# Patient Record
Sex: Female | Born: 1970 | Race: White | Hispanic: No | Marital: Married | Smoking: Former smoker
Health system: Southern US, Community
[De-identification: ages and names within clinical notes are randomized; demographics above are authoritative.]

## PROBLEM LIST (undated history)

## (undated) DIAGNOSIS — F419 Anxiety disorder, unspecified: Secondary | ICD-10-CM

## (undated) DIAGNOSIS — K219 Gastro-esophageal reflux disease without esophagitis: Secondary | ICD-10-CM

## (undated) DIAGNOSIS — F329 Major depressive disorder, single episode, unspecified: Secondary | ICD-10-CM

## (undated) DIAGNOSIS — I10 Essential (primary) hypertension: Secondary | ICD-10-CM

## (undated) DIAGNOSIS — F32A Depression, unspecified: Secondary | ICD-10-CM

## (undated) DIAGNOSIS — G2581 Restless legs syndrome: Secondary | ICD-10-CM

## (undated) DIAGNOSIS — F319 Bipolar disorder, unspecified: Secondary | ICD-10-CM

## (undated) DIAGNOSIS — R109 Unspecified abdominal pain: Secondary | ICD-10-CM

## (undated) HISTORY — DX: Essential (primary) hypertension: I10

## (undated) HISTORY — PX: CHOLECYSTECTOMY: SHX55

## (undated) HISTORY — PX: TONSILLECTOMY: SUR1361

## (undated) HISTORY — PX: BARIATRIC SURGERY: SHX1103

## (undated) HISTORY — DX: Depression, unspecified: F32.A

## (undated) HISTORY — DX: Major depressive disorder, single episode, unspecified: F32.9

## (undated) HISTORY — PX: GALLBLADDER SURGERY: SHX652

## (undated) HISTORY — DX: Bipolar disorder, unspecified: F31.9

## (undated) HISTORY — DX: Anxiety disorder, unspecified: F41.9

---

## 2008-09-23 DIAGNOSIS — G629 Polyneuropathy, unspecified: Secondary | ICD-10-CM | POA: Insufficient documentation

## 2014-10-03 DIAGNOSIS — R251 Tremor, unspecified: Secondary | ICD-10-CM | POA: Insufficient documentation

## 2014-10-03 DIAGNOSIS — R259 Unspecified abnormal involuntary movements: Secondary | ICD-10-CM | POA: Insufficient documentation

## 2014-10-03 DIAGNOSIS — G2581 Restless legs syndrome: Secondary | ICD-10-CM | POA: Insufficient documentation

## 2014-11-21 DIAGNOSIS — G479 Sleep disorder, unspecified: Secondary | ICD-10-CM | POA: Insufficient documentation

## 2015-03-23 ENCOUNTER — Encounter: Payer: Self-pay | Admitting: Psychiatry

## 2015-03-23 ENCOUNTER — Ambulatory Visit (INDEPENDENT_AMBULATORY_CARE_PROVIDER_SITE_OTHER): Payer: No Typology Code available for payment source | Admitting: Psychiatry

## 2015-03-23 VITALS — BP 122/82 | HR 101 | Temp 98.0°F | Ht 63.0 in | Wt 212.0 lb

## 2015-03-23 DIAGNOSIS — F3160 Bipolar disorder, current episode mixed, unspecified: Secondary | ICD-10-CM | POA: Diagnosis not present

## 2015-03-23 DIAGNOSIS — K219 Gastro-esophageal reflux disease without esophagitis: Secondary | ICD-10-CM | POA: Insufficient documentation

## 2015-03-23 DIAGNOSIS — F319 Bipolar disorder, unspecified: Secondary | ICD-10-CM | POA: Insufficient documentation

## 2015-03-23 DIAGNOSIS — I10 Essential (primary) hypertension: Secondary | ICD-10-CM | POA: Insufficient documentation

## 2015-03-23 MED ORDER — LITHIUM CARBONATE ER 450 MG PO TBCR: 450.0000 mg | EXTENDED_RELEASE_TABLET | Freq: Every day | ORAL | Status: DC

## 2015-03-23 MED ORDER — ALPRAZOLAM 0.25 MG PO TABS: 0.2500 mg | ORAL_TABLET | Freq: Every evening | ORAL | Status: DC | PRN

## 2015-03-23 MED ORDER — VENLAFAXINE HCL ER 75 MG PO CP24: 75.0000 mg | ORAL_CAPSULE | Freq: Every day | ORAL | Status: DC

## 2015-03-23 MED ORDER — QUETIAPINE FUMARATE 100 MG PO TABS: 50.0000 mg | ORAL_TABLET | Freq: Every day | ORAL | Status: DC
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
## 2015-04-24 ENCOUNTER — Ambulatory Visit (INDEPENDENT_AMBULATORY_CARE_PROVIDER_SITE_OTHER): Payer: No Typology Code available for payment source | Admitting: Psychiatry

## 2015-04-24 ENCOUNTER — Encounter: Payer: Self-pay | Admitting: Psychiatry

## 2015-04-24 VITALS — BP 138/82 | HR 91 | Temp 97.9°F | Ht 63.0 in | Wt 205.8 lb

## 2015-04-24 DIAGNOSIS — F3160 Bipolar disorder, current episode mixed, unspecified: Secondary | ICD-10-CM | POA: Diagnosis not present

## 2015-04-24 MED ORDER — VENLAFAXINE HCL ER 75 MG PO CP24: 75.0000 mg | ORAL_CAPSULE | Freq: Every day | ORAL | Status: DC

## 2015-04-24 MED ORDER — QUETIAPINE FUMARATE ER 150 MG PO TB24: 150.0000 mg | ORAL_TABLET | Freq: Every day | ORAL | Status: DC
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
## 2015-05-18 ENCOUNTER — Ambulatory Visit: Payer: No Typology Code available for payment source | Admitting: Psychiatry

## 2015-05-23 ENCOUNTER — Ambulatory Visit: Payer: No Typology Code available for payment source | Admitting: Psychiatry

## 2015-05-25 ENCOUNTER — Ambulatory Visit (INDEPENDENT_AMBULATORY_CARE_PROVIDER_SITE_OTHER): Payer: No Typology Code available for payment source | Admitting: Psychiatry

## 2015-05-25 ENCOUNTER — Encounter: Payer: Self-pay | Admitting: Psychiatry

## 2015-05-25 VITALS — BP 124/88 | HR 78 | Temp 97.4°F | Ht 63.0 in | Wt 203.4 lb

## 2015-05-25 DIAGNOSIS — F319 Bipolar disorder, unspecified: Secondary | ICD-10-CM | POA: Diagnosis not present

## 2015-05-25 MED ORDER — QUETIAPINE FUMARATE 25 MG PO TABS: 25.0000 mg | ORAL_TABLET | Freq: Four times a day (QID) | ORAL | Status: DC

## 2015-05-25 MED ORDER — VENLAFAXINE HCL ER 75 MG PO CP24: 75.0000 mg | ORAL_CAPSULE | Freq: Every day | ORAL | Status: DC

## 2015-05-25 MED ORDER — ALPRAZOLAM 0.25 MG PO TABS
0.2500 mg | ORAL_TABLET | Freq: Every evening | ORAL | Status: DC | PRN
Start: 2015-05-25 — End: 2015-09-28
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
## 2015-06-06 DIAGNOSIS — N939 Abnormal uterine and vaginal bleeding, unspecified: Secondary | ICD-10-CM | POA: Insufficient documentation

## 2015-06-23 ENCOUNTER — Other Ambulatory Visit: Payer: Self-pay | Admitting: Obstetrics and Gynecology

## 2015-06-23 DIAGNOSIS — D259 Leiomyoma of uterus, unspecified: Secondary | ICD-10-CM

## 2015-07-04 ENCOUNTER — Ambulatory Visit: Payer: Self-pay

## 2015-08-23 NOTE — Progress Notes (Signed)
According to note on  04-24-15 pt is not taking the xanax and the lithium anymore.  According to note on  05-25-15 pt is still taking the seroquel  And effexor

## 2015-09-08 ENCOUNTER — Telehealth: Payer: Self-pay

## 2015-09-08 NOTE — Telephone Encounter (Signed)
left message that per dr. Jimmye Norman adviced to go to

## 2015-09-08 NOTE — Telephone Encounter (Signed)
pt called states she needs refill on her medications.  pt did not have a follow up appt scheduled so patient was made the next available for 09-28-15.  pt states that she is out of medication.  pt was told that dr. Gretel Acre was not in the office and would be out of the office until 09-28-15.  Pt was advised to go to er because dr. Gretel Acre would not be back into the office in January.

## 2015-09-11 NOTE — Telephone Encounter (Signed)
pt called .  pt was told that Dr. was out of the office and that she was advised from doctor on-call to go to er.  pt was very upset and wanted to speak with the supervisor. pt was given the [place] phone number.

## 2015-09-13 ENCOUNTER — Telehealth (HOSPITAL_COMMUNITY): Payer: Self-pay

## 2015-09-13 NOTE — Telephone Encounter (Signed)
Medication management - Telephone message left for pt. this RN received her call that she is in need of new orders for meds as was last prescribed Effexor XR and Seroquel 06/14/15 + 2 refills and needs new orders until returns 09/28/15 with Dr. Gretel Acre.   Patient reported she did not want to be out of medication while awaiting MD to return and informed on message this nurse would attempt to have filled and call her back once reviewed with a provider as patient states out as of this date.

## 2015-09-14 MED ORDER — QUETIAPINE FUMARATE 25 MG PO TABS: 25.0000 mg | ORAL_TABLET | Freq: Four times a day (QID) | ORAL | Status: DC

## 2015-09-14 MED ORDER — VENLAFAXINE HCL ER 75 MG PO CP24
75.0000 mg | ORAL_CAPSULE | Freq: Every day | ORAL | Status: DC
Start: 2015-09-14 — End: 2015-09-28

## 2015-09-14 NOTE — Telephone Encounter (Signed)
Yes, can do it

## 2015-09-14 NOTE — Telephone Encounter (Signed)
Met with Dr. Donnelly Angelica, helping to cover for Dr. Gretel Acre out of the office this week, who approved a one time refill of patient's Seroquel and Effexor XR.  New orders e-scribed to patient's [facility] in [place], [place] and left patient a message these orders had been sent to her pharmacy and to call us back if any problems getting refills or questions.  Reminded patient on message of her next evaluation with Dr. Gretel Acre set for 09/28/15 at 2pm.

## 2015-09-15 NOTE — Telephone Encounter (Signed)
Reorder

## 2015-09-28 ENCOUNTER — Encounter: Payer: Self-pay | Admitting: Psychiatry

## 2015-09-28 ENCOUNTER — Ambulatory Visit (INDEPENDENT_AMBULATORY_CARE_PROVIDER_SITE_OTHER): Payer: 59 | Admitting: Psychiatry

## 2015-09-28 VITALS — BP 128/78 | HR 103 | Temp 97.4°F | Ht 63.0 in | Wt 206.6 lb

## 2015-09-28 DIAGNOSIS — F3160 Bipolar disorder, current episode mixed, unspecified: Secondary | ICD-10-CM | POA: Diagnosis not present

## 2015-09-28 MED ORDER — VENLAFAXINE HCL ER 75 MG PO CP24: 75.0000 mg | ORAL_CAPSULE | Freq: Every day | ORAL | Status: DC

## 2015-09-28 MED ORDER — ALPRAZOLAM 0.25 MG PO TABS: 0.2500 mg | ORAL_TABLET | Freq: Every evening | ORAL | Status: DC | PRN

## 2015-09-28 MED ORDER — QUETIAPINE FUMARATE 25 MG PO TABS: 25.0000 mg | ORAL_TABLET | Freq: Four times a day (QID) | ORAL | Status: DC
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
## 2015-10-19 ENCOUNTER — Ambulatory Visit
Admission: RE | Admit: 2015-10-19 | Discharge: 2015-10-19 | Disposition: A | Discharge status: Home / Self Care | Payer: Managed Care, Other (non HMO) | Source: Ambulatory Visit | Attending: Obstetrics and Gynecology | Admitting: Obstetrics and Gynecology

## 2015-10-19 DIAGNOSIS — D252 Subserosal leiomyoma of uterus: Secondary | ICD-10-CM | POA: Diagnosis not present

## 2015-10-19 DIAGNOSIS — D259 Leiomyoma of uterus, unspecified: Secondary | ICD-10-CM

## 2015-10-19 DIAGNOSIS — N7011 Chronic salpingitis: Secondary | ICD-10-CM | POA: Diagnosis not present

## 2015-10-19 MED ORDER — GADOBENATE DIMEGLUMINE 529 MG/ML IV SOLN
20.0000 mL | Freq: Once | INTRAVENOUS | Status: AC | PRN
  Administered 2015-10-19: 19 mL via INTRAVENOUS

## 2015-12-28 ENCOUNTER — Ambulatory Visit (INDEPENDENT_AMBULATORY_CARE_PROVIDER_SITE_OTHER): Payer: 59 | Admitting: Psychiatry

## 2015-12-28 ENCOUNTER — Encounter: Payer: Self-pay | Admitting: Psychiatry

## 2015-12-28 VITALS — BP 122/84 | HR 99 | Temp 98.5°F | Ht 63.0 in | Wt 205.0 lb

## 2015-12-28 DIAGNOSIS — F3160 Bipolar disorder, current episode mixed, unspecified: Secondary | ICD-10-CM

## 2015-12-28 MED ORDER — VENLAFAXINE HCL ER 75 MG PO CP24: 75.0000 mg | ORAL_CAPSULE | Freq: Every day | ORAL | Status: DC

## 2015-12-28 MED ORDER — QUETIAPINE FUMARATE 25 MG PO TABS: 25.0000 mg | ORAL_TABLET | Freq: Four times a day (QID) | ORAL | Status: DC
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
## 2016-01-01 MED ORDER — BUPIVACAINE HCL (PF) 0.5 % IJ SOLN
INTRAMUSCULAR | Status: AC
  Filled 2016-01-01: qty 30

## 2016-01-02 ENCOUNTER — Encounter
Admission: RE | Admit: 2016-01-02 | Discharge: 2016-01-02 | Disposition: A | Discharge status: Home / Self Care | Payer: Managed Care, Other (non HMO) | Source: Ambulatory Visit | Attending: Obstetrics and Gynecology | Admitting: Obstetrics and Gynecology

## 2016-01-02 DIAGNOSIS — Z01810 Encounter for preprocedural cardiovascular examination: Secondary | ICD-10-CM | POA: Diagnosis not present

## 2016-01-02 DIAGNOSIS — Z01812 Encounter for preprocedural laboratory examination: Secondary | ICD-10-CM | POA: Diagnosis present

## 2016-01-02 HISTORY — DX: Gastro-esophageal reflux disease without esophagitis: K21.9

## 2016-01-02 HISTORY — DX: Restless legs syndrome: G25.81

## 2016-01-02 LAB — CBC
HCT: 36 % (ref 35.0–47.0)
HEMOGLOBIN: 11.6 g/dL — AB (ref 12.0–16.0)
MCH: 25.3 pg — ABNORMAL LOW (ref 26.0–34.0)
MCHC: 32.2 g/dL (ref 32.0–36.0)
MCV: 78.7 fL — ABNORMAL LOW (ref 80.0–100.0)
PLATELETS: 416 10*3/uL (ref 150–440)
RBC: 4.57 MIL/uL (ref 3.80–5.20)
RDW: 18.6 % — ABNORMAL HIGH (ref 11.5–14.5)
WBC: 10.8 10*3/uL (ref 3.6–11.0)

## 2016-01-02 LAB — BASIC METABOLIC PANEL
ANION GAP: 6 (ref 5–15)
BUN: 7 mg/dL (ref 6–20)
CALCIUM: 9.1 mg/dL (ref 8.9–10.3)
CO2: 27 mmol/L (ref 22–32)
Chloride: 102 mmol/L (ref 101–111)
Creatinine, Ser: 0.72 mg/dL (ref 0.44–1.00)
GFR calc Af Amer: >60 mL/min (ref >60)
Glucose, Bld: 95 mg/dL (ref 65–99)
POTASSIUM: 4 mmol/L (ref 3.5–5.1)
SODIUM: 135 mmol/L (ref 135–145)

## 2016-01-02 LAB — ABO/RH: ABO/RH(D): A NEG

## 2016-01-02 LAB — TYPE AND SCREEN
ABO/RH(D): A NEG
ANTIBODY SCREEN: NEGATIVE

## 2016-01-02 NOTE — H&P (Signed)
Patient ID: Tamara Thomas is a 45 y.o. female presenting with Pre Op Consulting  on 01/02/2016  HPI: F/u for MRI for surgical planning. Pelvic pain, AUB-F. Large fibroids x2, exophytic. Bleeding resolved but pain remains. Pain described as sharp and crampy, present consistently, along round ligament bilaterally. Moloxicam and flexeril not improving pain. 800mg  motrin does for an 1hr.  Pt has hx of lap band (2006) and gastric sleeve surgeries (2016), as well as two D&C after SABs.  MRI 10/19/2015:  Uterine measures 10.7 cm in length by 5.7 cm transverse by 4.8 cm  anterior-posterior if exclude the large left posterior primarily  subserosal fibroid. The endometrial stripe measures 1.1 cm in  thickness which is normal for age. There is diffusely low T2 signal  in the uterus which could reflect adenomyosis presuming this  represents thickening of the junctional zone. Multiple nabothian  cysts along the cervix.   Along the left posterior uterine body, there is an 11.4 by 6.9 by  9.7 cm subserosal exophytic uterine fibroid which demonstrates  diffuse enhancement with some nodular heterogeneity. No other  fibroids are observed.   Ovaries/adnexa: There is mild bilateral hydrosalpinx. Right ovary  measures 3.5 by 2.6 by 3.9 cm and appears normal. Left ovary  measures 3.0 by 3.2 by 2.4 cm and appears normal. No abnormal  enhancement of either ovary. Both ovaries demonstrate scattered  small follicles. One of the follicles in in the right ovary  posteriorly has high precontrast T1 signal suggesting complex fluid  in a corpus luteum.   IMPRESSION:  1. The dominant finding is a large subserosal fibroid along the left  posterior uterine body measuring 11.4 by 6.9 by 9.7 cm, with diffuse  internal enhancement with some nodular heterogeneity, displacing the  uterus slightly to the right.  2. Mild bilateral hydrosalpinx, cause uncertain. The ovaries  appear  normal bilaterally.  3. There is low T2 signal throughout the myometrium -this may  represent thickening of the junctional zone in the setting of  adenomyosis. However, we do not demonstrate foci of high T1 signal  scattered in the myometrium.  Past Medical History:  has a past medical history of Bipolar disorder (CMS-HCC); Essential hypertension with goal blood pressure less than 140/90; GERD without esophagitis; Restless legs syndrome; and Tremor.  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 Family History: family history includes Hypertension in her father; No Known Problems in her sister; Tremor in her father. Social History:  reports that she has been smoking Cigarettes. She has a 14.00 pack-year smoking history. She has never used smokeless tobacco. She reports that she does not drink alcohol or use illicit drugs. OB/GYN History:  OB History    Gravida Para Term Preterm AB TAB SAB Ectopic Multiple Living   2    2  2          Allergies: is allergic to erythromycin. Medications:  Current Outpatient Prescriptions:  . ALPRAZolam (XANAX) 0.25 MG tablet, Take by mouth once daily as needed. , Disp: , Rfl:  . carbidopa-levodopa (SINEMET) 25-100 mg tablet, Take 1-2 tablets at night, Disp: 60 tablet, Rfl: 1 . cyclobenzaprine (FLEXERIL) 10 MG tablet, Take 1 tablet (10 mg total) by mouth 3 (three) times daily as needed for Muscle spasms. Take one tablet 3 x a day prn muscle spasms., Disp: 30 tablet, Rfl: 1 . DEXILANT 60 mg  DR capsule, TAKE ONE CAPSULE BY MOUTH EVERY DAY, Disp: 30 capsule, Rfl: 0 . fluticasone (FLONASE) 50 mcg/actuation nasal spray, Place 2 sprays into both nostrils once daily as needed for Rhinitis., Disp: , Rfl:  . gabapentin (NEURONTIN) 800 MG tablet, Take 1/2 tab at night for rls,  Disp: 15 tablet, Rfl: 3 . hydroCHLOROthiazide (HYDRODIURIL) 12.5 MG tablet, Take 1 tablet (12.5 mg total) by mouth once daily., Disp: 30 tablet, Rfl: 3 . losartan (COZAAR) 100 MG tablet, Take 1 tablet (100 mg total) by mouth once daily., Disp: 90 tablet, Rfl: 1 . meloxicam (MOBIC) 7.5 MG tablet, Take 1 tablet (7.5 mg total) by mouth once daily., Disp: 30 tablet, Rfl: 3 . metoclopramide (REGLAN) 10 MG tablet, 1 tab with meals as needed, Disp: 90 tablet, Rfl: 3 . pramipexole (MIRAPEX) 0.5 MG tablet, TAKE 1 1/2 TABLET BY MOUTH EVERY EVENING, Disp: 45 tablet, Rfl: 0 . pramipexole (MIRAPEX) 0.5 MG tablet, Take 1 tab in the morning and 1.5 tabs at night., Disp: 75 tablet, Rfl: 1 . predniSONE (DELTASONE) 20 MG tablet, 2qam for 4 days, 1qam for 4days, Disp: 12 tablet, Rfl: 0 . QUEtiapine (SEROQUEL) 50 MG tablet, Per Patient, 1/2 tablet in AM, 3 hs, Disp: , Rfl: 0 . rOPINIRole (REQUIP) 0.5 MG tablet, Take 1 tablet at bedtime for 2 nights then increase to 2 tablets at bedtime, Disp: 60 tablet, Rfl: 1 . traMADol (ULTRAM) 50 mg tablet, Take 1 tablet up to twice per day for foot pain, Disp: 60 tablet, Rfl: 0 . venlafaxine (EFFEXOR-XR) 75 MG XR capsule, TK 1 C PO QD WITH BREAKFAST, Disp: , Rfl: 0   Review of Systems: No SOB, no palpitations or chest pain, no new lower extremity edema, no nausea or vomiting or bowel or bladder complaints. See HPI for gyn specific ROS.  Exam:         Visit Vitals  . BP 122/78  . Wt 92.4 kg (203 lb 12.8 oz)  . BMI 36.1 kg/m2    General: Patient is well-groomed, well-nourished, appears stated age in no acute distress  HEENT: head is atraumatic and normocephalic, trachea is midline, neck is supple with no palpable nodules  CV: Regular rhythm and normal heart rate, no murmur  Pulm: Clear to auscultation throughout lung fields with no wheezing, crackles, or rhonchi. No increased work of breathing  Abdomen: soft , no mass, non-tender, no rebound tenderness,  no hepatomegaly  Pelvic: tanner stage 5 ,  External genitalia: vulva /labia no lesions Urethra: no prolapse Vagina: normal physiologic d/c, laxity in vaginal walls Cervix: no lesions, no cervical motion tenderness, good descent Uterus: normal size shape and contour, non-tender Adnexa: no mass, non-tender  Rectovaginal: External wnl   Impression:   The primary encounter diagnosis was Abnormal uterine bleeding (AUB). Diagnoses of Fibroids, subserous and Pelvic pain in female were also pertinent to this visit.    Plan:   - Pelvic pain, AUB: Large Exophytic fibroids and possibly adenomyosis noted. Plan for TAH, BS today. Discussed Lupron (Hx of bipolar d/o) and [place] to shrink fibroids. Neither do I recommend for her in setting of bipolar disorder and pelvic pain. Has now failed medical management and desires definitive surgical management.  Originally planned for [facility], BS but placement of fibroids along posterior makes me concerned that controlling uterine blood supply may be difficult, and the size makes me concerned that visualization will be compromised. Therefore, open surgery is recommended. I have discussed the risks and benefits and alternatives to this surgery.  This is an open procedure. The patient and I discussed the technical aspects of the procedure including the potential for risks and complications. These include but are not limited to the risk of infection requiring post-operative antibiotics or further procedures. We talked about the risk of injury to adjacent organs including bladder, bowel, ureter, blood vessels or nerves. We talked about the need to convert to an open incision. We talked about the possible need for blood transfusion. We talked aboutpostop complications such asthromboembolic or cardiopulmonary complications. She will likely remain in the hospital for several days after her procedure. All of her questions were answered. Her preoperative exam  was completed and the appropriate consents were signed. She is scheduled to undergo this procedure in two weeks.  Specific Peri-operative Considerations:  - Consent: obtained today - Health Maintenance: Pap and EMBx on 9*16 nwg - Labs: CBC, CMP preoperatively - Studies: EKG, CXR preoperatively - Bowel Preparation: None required - Abx: Cefoxitin 2g - VTE ppx: SCDs perioperatively - Glucose Protocol: n/a - Beta-blockade: n/a

## 2016-01-02 NOTE — Pre-Procedure Instructions (Signed)
MEDICAL CLEARANCE REQUEST/EKG AS INSTRUCTED BY DR Kayleen Memos CALLED AND FAXED TO DR Netty Starring. SPOKE WITH [place]. ALSO CALLED AND FAXED TO DR Leafy Ro. SPOKE [place]

## 2016-01-02 NOTE — Patient Instructions (Signed)
  Your procedure is scheduled [id number] January 15, 2016. Report to Same Day Surgery. To find out your arrival time please call [phone] between 1PM - 3PM on Friday January 12, 2016 .  Remember: Instructions that are not followed completely may result in serious medical risk, up to and including death, or upon the discretion of your surgeon and anesthesiologist your surgery may need to be rescheduled.    _x___ 1. Do not eat food or drink liquids after midnight. No gum chewing or hard candies.     ____ 2. No Alcohol for 24 hours before or after surgery.   ____ 3. Bring all medications with you on the day of surgery if instructed.    __x__ 4. Notify your doctor if there is any change in your medical condition     (cold, fever, infections).     Do not wear jewelry, make-up, hairpins, clips or nail polish.  Do not wear lotions, powders, or perfumes. You may wear deodorant.  Do not shave 48 hours prior to surgery. Men may shave face and neck.  Do not bring valuables to the hospital.    [facility] is not responsible for any belongings or valuables.               Contacts, dentures or bridgework may not be worn into surgery.  Leave your suitcase in the car. After surgery it may be brought to your room.  For patients admitted to the hospital, discharge time is determined by your treatment team.   Patients discharged the day of surgery will not be allowed to drive home.    Please read over the following fact sheets that you were given:   [facility] Preparing for Surgery  _x_ Take these medicines the morning of surgery with A SIP OF WATER:    1. venlafaxine XR (EFFEXOR-XR)    ____ Fleet Enema (as directed)   _x___ Use CHG Soap as directed on instruction sheet  ____ Use inhalers on the day of surgery and bring to hospital day of surgery  ____ Stop metformin 2 days prior to surgery    ____ Take 1/2 of usual insulin dose the night before surgery and none on the morning of  surgery.    ____ Stop Coumadin/Plavix/aspirin on does not apply.  _x__ Stop Anti-inflammatories such as Advil, Aleve, Ibuprofen, Motrin, Naproxen,  Naprosyn, Goodies powders or aspirin products. OK to take Tylenol.   ____ Stop supplements until after surgery.    ____ Bring C-Pap to the hospital.

## 2016-01-08 NOTE — Pre-Procedure Instructions (Signed)
Spoke with dr Loney Hering office re status of clearance.

## 2016-01-09 NOTE — Pre-Procedure Instructions (Signed)
Spoke with rebecca at pcp office. They need ekg faxed. refaxed

## 2016-01-11 NOTE — Pre-Procedure Instructions (Signed)
CLEARED BY DR Netty Starring

## 2016-01-15 ENCOUNTER — Inpatient Hospital Stay
Admission: RE | Admit: 2016-01-15 | Discharge: 2016-01-17 | DRG: 743 | Disposition: A | Discharge status: Home / Self Care | Payer: Managed Care, Other (non HMO) | Source: Ambulatory Visit | Attending: Obstetrics and Gynecology | Admitting: Obstetrics and Gynecology

## 2016-01-15 ENCOUNTER — Inpatient Hospital Stay: Payer: Managed Care, Other (non HMO) | Admitting: Certified Registered Nurse Anesthetist

## 2016-01-15 ENCOUNTER — Encounter
Admission: RE | Disposition: A | Discharge status: Home / Self Care | Payer: Self-pay | Source: Ambulatory Visit | Attending: Obstetrics and Gynecology

## 2016-01-15 ENCOUNTER — Encounter: Payer: Self-pay | Admitting: *Deleted

## 2016-01-15 DIAGNOSIS — I10 Essential (primary) hypertension: Secondary | ICD-10-CM | POA: Diagnosis present

## 2016-01-15 DIAGNOSIS — Z9884 Bariatric surgery status: Secondary | ICD-10-CM

## 2016-01-15 DIAGNOSIS — K219 Gastro-esophageal reflux disease without esophagitis: Secondary | ICD-10-CM | POA: Diagnosis present

## 2016-01-15 DIAGNOSIS — Z9889 Other specified postprocedural states: Secondary | ICD-10-CM | POA: Diagnosis not present

## 2016-01-15 DIAGNOSIS — N736 Female pelvic peritoneal adhesions (postinfective): Secondary | ICD-10-CM | POA: Diagnosis present

## 2016-01-15 DIAGNOSIS — Z7951 Long term (current) use of inhaled steroids: Secondary | ICD-10-CM | POA: Diagnosis not present

## 2016-01-15 DIAGNOSIS — D259 Leiomyoma of uterus, unspecified: Secondary | ICD-10-CM | POA: Diagnosis present

## 2016-01-15 DIAGNOSIS — Z8249 Family history of ischemic heart disease and other diseases of the circulatory system: Secondary | ICD-10-CM

## 2016-01-15 DIAGNOSIS — N7011 Chronic salpingitis: Secondary | ICD-10-CM | POA: Diagnosis present

## 2016-01-15 DIAGNOSIS — D252 Subserosal leiomyoma of uterus: Secondary | ICD-10-CM | POA: Diagnosis present

## 2016-01-15 DIAGNOSIS — G2581 Restless legs syndrome: Secondary | ICD-10-CM | POA: Diagnosis present

## 2016-01-15 DIAGNOSIS — Z9049 Acquired absence of other specified parts of digestive tract: Secondary | ICD-10-CM | POA: Diagnosis not present

## 2016-01-15 DIAGNOSIS — F17210 Nicotine dependence, cigarettes, uncomplicated: Secondary | ICD-10-CM | POA: Diagnosis present

## 2016-01-15 DIAGNOSIS — Z79899 Other long term (current) drug therapy: Secondary | ICD-10-CM

## 2016-01-15 DIAGNOSIS — Z888 Allergy status to other drugs, medicaments and biological substances status: Secondary | ICD-10-CM | POA: Diagnosis not present

## 2016-01-15 DIAGNOSIS — N920 Excessive and frequent menstruation with regular cycle: Secondary | ICD-10-CM | POA: Diagnosis present

## 2016-01-15 LAB — BASIC METABOLIC PANEL
Anion gap: 9 (ref 5–15)
BUN: 8 mg/dL (ref 6–20)
CHLORIDE: 103 mmol/L (ref 101–111)
CO2: 24 mmol/L (ref 22–32)
CREATININE: 0.79 mg/dL (ref 0.44–1.00)
Calcium: 8.5 mg/dL — ABNORMAL LOW (ref 8.9–10.3)
GFR calc Af Amer: >60 mL/min (ref >60)
GFR calc non Af Amer: >60 mL/min (ref >60)
GLUCOSE: 145 mg/dL — AB (ref 65–99)
POTASSIUM: 4.1 mmol/L (ref 3.5–5.1)
SODIUM: 136 mmol/L (ref 135–145)

## 2016-01-15 LAB — CBC
HCT: 33.6 % — ABNORMAL LOW (ref 35.0–47.0)
HEMOGLOBIN: 10.8 g/dL — AB (ref 12.0–16.0)
MCH: 25.4 pg — AB (ref 26.0–34.0)
MCHC: 32.1 g/dL (ref 32.0–36.0)
MCV: 79 fL — AB (ref 80.0–100.0)
Platelets: 400 10*3/uL (ref 150–440)
RBC: 4.25 MIL/uL (ref 3.80–5.20)
RDW: 18.2 % — ABNORMAL HIGH (ref 11.5–14.5)
WBC: 20.5 10*3/uL — ABNORMAL HIGH (ref 3.6–11.0)

## 2016-01-15 LAB — POCT PREGNANCY, URINE: Preg Test, Ur: NEGATIVE

## 2016-01-15 SURGERY — HYSTERECTOMY, TOTAL, ABDOMINAL, WITH SALPINGECTOMY
Anesthesia: General | Laterality: Bilateral | Wound class: Clean Contaminated

## 2016-01-15 MED ORDER — LOSARTAN POTASSIUM 50 MG PO TABS
25.0000 mg | ORAL_TABLET | Freq: Every day | ORAL | Status: DC
  Filled 2016-01-15: qty 0.5

## 2016-01-15 MED ORDER — DIPHENHYDRAMINE HCL 50 MG/ML IJ SOLN
12.5000 mg | Freq: Four times a day (QID) | INTRAMUSCULAR | Status: DC | PRN
  Administered 2016-01-16: 12.5 mg via INTRAVENOUS
  Filled 2016-01-15 (×2): qty 1

## 2016-01-15 MED ORDER — NALOXONE HCL 0.4 MG/ML IJ SOLN: 0.4000 mg | INTRAMUSCULAR | Status: DC | PRN

## 2016-01-15 MED ORDER — ROCURONIUM BROMIDE 100 MG/10ML IV SOLN
INTRAVENOUS | Status: DC | PRN
  Administered 2016-01-15: 5 mg via INTRAVENOUS
  Administered 2016-01-15 (×2): 10 mg via INTRAVENOUS
  Administered 2016-01-15: 5 mg via INTRAVENOUS
  Administered 2016-01-15: 35 mg via INTRAVENOUS
  Administered 2016-01-15 (×2): 10 mg via INTRAVENOUS

## 2016-01-15 MED ORDER — PROPOFOL 10 MG/ML IV BOLUS
INTRAVENOUS | Status: DC | PRN
  Administered 2016-01-15: 180 mg via INTRAVENOUS

## 2016-01-15 MED ORDER — HYDROMORPHONE 1 MG/ML IV SOLN
INTRAVENOUS | Status: DC
  Administered 2016-01-15: 14:00:00 via INTRAVENOUS
  Administered 2016-01-15: 3.3 mg via INTRAVENOUS
  Filled 2016-01-15: qty 25

## 2016-01-15 MED ORDER — FAMOTIDINE 20 MG PO TABS
20.0000 mg | ORAL_TABLET | Freq: Once | ORAL | Status: AC
  Administered 2016-01-15: 20 mg via ORAL

## 2016-01-15 MED ORDER — DOCUSATE SODIUM 100 MG PO CAPS
100.0000 mg | ORAL_CAPSULE | Freq: Two times a day (BID) | ORAL | Status: DC
  Administered 2016-01-15 – 2016-01-16 (×3): 100 mg via ORAL
  Filled 2016-01-15 (×3): qty 1

## 2016-01-15 MED ORDER — FENTANYL CITRATE (PF) 100 MCG/2ML IJ SOLN
INTRAMUSCULAR | Status: AC
  Administered 2016-01-15: 25 ug via INTRAVENOUS
  Filled 2016-01-15: qty 2

## 2016-01-15 MED ORDER — IBUPROFEN 600 MG PO TABS
600.0000 mg | ORAL_TABLET | Freq: Four times a day (QID) | ORAL | Status: DC
  Administered 2016-01-15 – 2016-01-17 (×6): 600 mg via ORAL
  Filled 2016-01-15 (×5): qty 1

## 2016-01-15 MED ORDER — HYDROMORPHONE 1 MG/ML IV SOLN
INTRAVENOUS | Status: DC
  Administered 2016-01-15: 2.5 mg via INTRAVENOUS
  Administered 2016-01-16: 2.73 mg via INTRAVENOUS
  Administered 2016-01-16: 3.3 mg via INTRAVENOUS

## 2016-01-15 MED ORDER — MIDAZOLAM HCL 2 MG/2ML IJ SOLN
INTRAMUSCULAR | Status: DC | PRN
  Administered 2016-01-15: 2 mg via INTRAVENOUS

## 2016-01-15 MED ORDER — OXYCODONE-ACETAMINOPHEN 5-325 MG PO TABS
1.0000 | ORAL_TABLET | ORAL | Status: DC | PRN
  Administered 2016-01-16: 2 via ORAL
  Administered 2016-01-16: 1 via ORAL
  Filled 2016-01-15: qty 1
  Filled 2016-01-15 (×2): qty 2

## 2016-01-15 MED ORDER — FAMOTIDINE 20 MG PO TABS
ORAL_TABLET | ORAL | Status: AC
  Administered 2016-01-15: 20 mg via ORAL
  Filled 2016-01-15: qty 1

## 2016-01-15 MED ORDER — FENTANYL CITRATE (PF) 100 MCG/2ML IJ SOLN
25.0000 ug | INTRAMUSCULAR | Status: AC | PRN
  Administered 2016-01-15 (×6): 25 ug via INTRAVENOUS

## 2016-01-15 MED ORDER — ACETAMINOPHEN 10 MG/ML IV SOLN
INTRAVENOUS | Status: AC
  Filled 2016-01-15: qty 100

## 2016-01-15 MED ORDER — GABAPENTIN 800 MG PO TABS
400.0000 mg | ORAL_TABLET | Freq: Every day | ORAL | Status: DC
  Filled 2016-01-15: qty 0.5

## 2016-01-15 MED ORDER — ACETAMINOPHEN 10 MG/ML IV SOLN
INTRAVENOUS | Status: DC | PRN
  Administered 2016-01-15: 1000 mg via INTRAVENOUS

## 2016-01-15 MED ORDER — KETOROLAC TROMETHAMINE 30 MG/ML IJ SOLN
30.0000 mg | Freq: Once | INTRAMUSCULAR | Status: AC
  Administered 2016-01-15: 30 mg via INTRAVENOUS
  Filled 2016-01-15: qty 1

## 2016-01-15 MED ORDER — GABAPENTIN 400 MG PO CAPS
400.0000 mg | ORAL_CAPSULE | Freq: Every day | ORAL | Status: DC
  Filled 2016-01-15 (×3): qty 1

## 2016-01-15 MED ORDER — QUETIAPINE FUMARATE 25 MG PO TABS
75.0000 mg | ORAL_TABLET | Freq: Four times a day (QID) | ORAL | Status: DC
  Administered 2016-01-15 – 2016-01-16 (×2): 75 mg via ORAL
  Filled 2016-01-15 (×2): qty 3

## 2016-01-15 MED ORDER — LACTATED RINGERS IV SOLN: INTRAVENOUS | Status: DC

## 2016-01-15 MED ORDER — DIPHENHYDRAMINE HCL 12.5 MG/5ML PO ELIX
12.5000 mg | ORAL_SOLUTION | Freq: Four times a day (QID) | ORAL | Status: DC | PRN
  Filled 2016-01-15: qty 5

## 2016-01-15 MED ORDER — SUGAMMADEX SODIUM 200 MG/2ML IV SOLN
INTRAVENOUS | Status: DC | PRN
  Administered 2016-01-15: 186 mg via INTRAVENOUS

## 2016-01-15 MED ORDER — CEFAZOLIN SODIUM-DEXTROSE 2-4 GM/100ML-% IV SOLN
2.0000 g | INTRAVENOUS | Status: AC
  Administered 2016-01-15: 2 g via INTRAVENOUS

## 2016-01-15 MED ORDER — ONDANSETRON HCL 4 MG/2ML IJ SOLN: 4.0000 mg | Freq: Once | INTRAMUSCULAR | Status: DC | PRN

## 2016-01-15 MED ORDER — ONDANSETRON HCL 4 MG/2ML IJ SOLN
4.0000 mg | Freq: Four times a day (QID) | INTRAMUSCULAR | Status: DC | PRN
  Administered 2016-01-15: 4 mg via INTRAVENOUS
  Filled 2016-01-15 (×2): qty 2

## 2016-01-15 MED ORDER — METOCLOPRAMIDE HCL 10 MG PO TABS: 10.0000 mg | ORAL_TABLET | Freq: Four times a day (QID) | ORAL | Status: DC | PRN

## 2016-01-15 MED ORDER — PANTOPRAZOLE SODIUM 40 MG PO TBEC
40.0000 mg | DELAYED_RELEASE_TABLET | Freq: Every day | ORAL | Status: DC
  Administered 2016-01-16: 40 mg via ORAL
  Filled 2016-01-15: qty 1

## 2016-01-15 MED ORDER — VASOPRESSIN 20 UNIT/ML IV SOLN: INTRAVENOUS | Status: DC | PRN

## 2016-01-15 MED ORDER — LACTATED RINGERS IV SOLN
INTRAVENOUS | Status: DC
  Administered 2016-01-15 (×2): via INTRAVENOUS

## 2016-01-15 MED ORDER — SODIUM CHLORIDE 0.9% FLUSH: 9.0000 mL | INTRAVENOUS | Status: DC | PRN

## 2016-01-15 MED ORDER — CEFAZOLIN SODIUM-DEXTROSE 2-4 GM/100ML-% IV SOLN
INTRAVENOUS | Status: AC
Start: 2016-01-15 — End: 2016-01-15
  Administered 2016-01-15: 2 g via INTRAVENOUS
  Filled 2016-01-15: qty 100

## 2016-01-15 MED ORDER — KETOROLAC TROMETHAMINE 30 MG/ML IJ SOLN
INTRAMUSCULAR | Status: DC | PRN
  Administered 2016-01-15: 30 mg via INTRAVENOUS

## 2016-01-15 MED ORDER — POTASSIUM CHLORIDE CRYS ER 10 MEQ PO TBCR
10.0000 meq | EXTENDED_RELEASE_TABLET | Freq: Every day | ORAL | Status: DC
  Administered 2016-01-16: 10 meq via ORAL
  Filled 2016-01-15 (×3): qty 1

## 2016-01-15 MED ORDER — SODIUM CHLORIDE 0.9 % IJ SOLN
INTRAMUSCULAR | Status: AC
  Filled 2016-01-15: qty 50

## 2016-01-15 MED ORDER — PRAMIPEXOLE DIHYDROCHLORIDE 0.25 MG PO TABS
0.1250 mg | ORAL_TABLET | Freq: Every evening | ORAL | Status: DC
  Administered 2016-01-15 – 2016-01-16 (×2): 0.125 mg via ORAL
  Filled 2016-01-15 (×2): qty 0.5

## 2016-01-15 MED ORDER — DEXAMETHASONE SODIUM PHOSPHATE 10 MG/ML IJ SOLN
INTRAMUSCULAR | Status: DC | PRN
  Administered 2016-01-15: 10 mg via INTRAVENOUS

## 2016-01-15 MED ORDER — QUETIAPINE FUMARATE 25 MG PO TABS: 25.0000 mg | ORAL_TABLET | Freq: Every day | ORAL | Status: DC | PRN

## 2016-01-15 MED ORDER — ROTIGOTINE 4 MG/24HR TD PT24: 1.0000 | MEDICATED_PATCH | Freq: Every day | TRANSDERMAL | Status: DC | PRN

## 2016-01-15 MED ORDER — LIDOCAINE HCL (CARDIAC) 20 MG/ML IV SOLN
INTRAVENOUS | Status: DC | PRN
  Administered 2016-01-15: 100 mg via INTRAVENOUS

## 2016-01-15 MED ORDER — LOSARTAN POTASSIUM 25 MG PO TABS
25.0000 mg | ORAL_TABLET | Freq: Every day | ORAL | Status: DC
  Administered 2016-01-15: 25 mg via ORAL
  Filled 2016-01-15 (×2): qty 1

## 2016-01-15 MED ORDER — PHENYLEPHRINE HCL 10 MG/ML IJ SOLN
INTRAMUSCULAR | Status: DC | PRN
  Administered 2016-01-15: 100 ug via INTRAVENOUS
  Administered 2016-01-15: 200 ug via INTRAVENOUS
  Administered 2016-01-15: 100 ug via INTRAVENOUS

## 2016-01-15 MED ORDER — ONDANSETRON HCL 4 MG/2ML IJ SOLN
INTRAMUSCULAR | Status: DC | PRN
  Administered 2016-01-15: 4 mg via INTRAVENOUS

## 2016-01-15 MED ORDER — CYCLOBENZAPRINE HCL 5 MG PO TABS
5.0000 mg | ORAL_TABLET | Freq: Three times a day (TID) | ORAL | Status: DC | PRN
  Administered 2016-01-15 – 2016-01-16 (×3): 5 mg via ORAL
  Filled 2016-01-15: qty 1
  Filled 2016-01-15: qty 0.5
  Filled 2016-01-15: qty 1
  Filled 2016-01-15: qty 0.5
  Filled 2016-01-15: qty 1

## 2016-01-15 MED ORDER — FENTANYL CITRATE (PF) 100 MCG/2ML IJ SOLN
INTRAMUSCULAR | Status: DC | PRN
  Administered 2016-01-15: 50 ug via INTRAVENOUS
  Administered 2016-01-15: 100 ug via INTRAVENOUS
  Administered 2016-01-15 (×4): 50 ug via INTRAVENOUS

## 2016-01-15 MED ORDER — POLYSACCHARIDE IRON COMPLEX 150 MG PO CAPS
150.0000 mg | ORAL_CAPSULE | ORAL | Status: DC
  Administered 2016-01-16 – 2016-01-17 (×2): 150 mg via ORAL
  Filled 2016-01-15 (×2): qty 1

## 2016-01-15 MED ORDER — QUETIAPINE FUMARATE 25 MG PO TABS
25.0000 mg | ORAL_TABLET | Freq: Four times a day (QID) | ORAL | Status: DC
  Filled 2016-01-15 (×3): qty 1

## 2016-01-15 MED ORDER — ADULT MULTIVITAMIN W/MINERALS CH
1.0000 | ORAL_TABLET | ORAL | Status: DC
  Administered 2016-01-16 – 2016-01-17 (×2): 1 via ORAL
  Filled 2016-01-15 (×3): qty 1

## 2016-01-15 MED ORDER — LACTATED RINGERS IV SOLN
INTRAVENOUS | Status: DC
  Administered 2016-01-15 – 2016-01-16 (×2): via INTRAVENOUS

## 2016-01-15 MED ORDER — SUCCINYLCHOLINE CHLORIDE 20 MG/ML IJ SOLN
INTRAMUSCULAR | Status: DC | PRN
  Administered 2016-01-15: 100 mg via INTRAVENOUS

## 2016-01-15 MED ORDER — VENLAFAXINE HCL ER 75 MG PO CP24
75.0000 mg | ORAL_CAPSULE | Freq: Every day | ORAL | Status: DC
  Administered 2016-01-16 – 2016-01-17 (×2): 75 mg via ORAL
  Filled 2016-01-15 (×3): qty 1

## 2016-01-15 MED ORDER — MENTHOL 3 MG MT LOZG
1.0000 | LOZENGE | OROMUCOSAL | Status: DC | PRN
  Filled 2016-01-15: qty 9

## 2016-01-15 MED ORDER — HYDROCHLOROTHIAZIDE 25 MG PO TABS
25.0000 mg | ORAL_TABLET | Freq: Every day | ORAL | Status: DC
  Administered 2016-01-16: 25 mg via ORAL
  Filled 2016-01-15: qty 1

## 2016-01-15 MED ORDER — VASOPRESSIN 20 UNIT/ML IV SOLN
INTRAVENOUS | Status: AC
  Filled 2016-01-15: qty 1

## 2016-01-15 SURGICAL SUPPLY — 44 items
CANISTER SUCT 1200ML W/VALVE (MISCELLANEOUS) ×2 IMPLANT
CATH TRAY 16F METER LATEX (MISCELLANEOUS) ×2 IMPLANT
CHLORAPREP W/TINT 26ML (MISCELLANEOUS) ×2 IMPLANT
COUNTER NEEDLE 20/40 LG (NEEDLE) ×2 IMPLANT
COVER LIGHT HANDLE STERIS (MISCELLANEOUS) ×2 IMPLANT
DRAPE LAP W/FLUID (DRAPES) ×2 IMPLANT
DRAPE UNDER BUTTOCK W/FLU (DRAPES) ×2 IMPLANT
DRSG OPSITE POSTOP 4X10 (GAUZE/BANDAGES/DRESSINGS) ×2 IMPLANT
DRSG TELFA 3X8 NADH (GAUZE/BANDAGES/DRESSINGS) ×2 IMPLANT
ELECT CAUTERY BLADE 6.4 (BLADE) ×2 IMPLANT
ELECT REM PT RETURN 9FT ADLT (ELECTROSURGICAL) ×2
ELECTRODE REM PT RTRN 9FT ADLT (ELECTROSURGICAL) ×1 IMPLANT
GAUZE SPONGE 4X4 12PLY STRL (GAUZE/BANDAGES/DRESSINGS) ×2 IMPLANT
GLOVE BIO SURGEON STRL SZ 6.5 (GLOVE) ×12 IMPLANT
GLOVE INDICATOR 7.0 STRL GRN (GLOVE) ×8 IMPLANT
GOWN STRL REUS W/ TWL LRG LVL3 (GOWN DISPOSABLE) ×2 IMPLANT
GOWN STRL REUS W/ TWL XL LVL3 (GOWN DISPOSABLE) ×1 IMPLANT
GOWN STRL REUS W/TWL LRG LVL3 (GOWN DISPOSABLE) ×2
GOWN STRL REUS W/TWL XL LVL3 (GOWN DISPOSABLE) ×1
KIT RM TURNOVER CYSTO AR (KITS) ×2 IMPLANT
LABEL OR SOLS (LABEL) ×2 IMPLANT
NEEDLE FILTER BLUNT 18X 1/2SAF (NEEDLE) ×1
NEEDLE FILTER BLUNT 18X1 1/2 (NEEDLE) ×1 IMPLANT
NEEDLE HYPO 22GX1.5 SAFETY (NEEDLE) ×2 IMPLANT
NS IRRIG 500ML POUR BTL (IV SOLUTION) ×2 IMPLANT
PACK BASIN MAJOR ARMC (MISCELLANEOUS) ×2 IMPLANT
SET CYSTO W/LG BORE CLAMP LF (SET/KITS/TRAYS/PACK) ×2 IMPLANT
SOL PREP PVP 2OZ (MISCELLANEOUS)
SOLUTION PREP PVP 2OZ (MISCELLANEOUS) IMPLANT
SPONGE XRAY 4X4 16PLY STRL (MISCELLANEOUS) ×2 IMPLANT
STAPLER SKIN PROX 35W (STAPLE) ×2 IMPLANT
SURGILUBE 2OZ TUBE FLIPTOP (MISCELLANEOUS) ×2 IMPLANT
SUT MNCRL 3-0 (SUTURE) ×2 IMPLANT
SUT VIC AB 0 CT1 27 (SUTURE) ×5
SUT VIC AB 0 CT1 27XCR 8 STRN (SUTURE) ×5 IMPLANT
SUT VIC AB 0 CT1 36 (SUTURE) ×6 IMPLANT
SUT VIC AB 2-0 SH 27 (SUTURE) ×5
SUT VIC AB 2-0 SH 27XBRD (SUTURE) ×5 IMPLANT
SUT VICRYL PLUS ABS 0 54 (SUTURE) ×2 IMPLANT
SYR 3ML LL SCALE MARK (SYRINGE) ×2 IMPLANT
SYR BULB IRRIG 60ML STRL (SYRINGE) IMPLANT
SYRINGE 10CC LL (SYRINGE) ×2 IMPLANT
TRAY PREP VAG/GEN (MISCELLANEOUS) ×2 IMPLANT
WATER STERILE IRR 1000ML POUR (IV SOLUTION) ×2 IMPLANT

## 2016-01-15 NOTE — Op Note (Addendum)
Tamara Thomas PROCEDURE DATE: 01/15/2016  PREOPERATIVE DIAGNOSIS:  Symptomatic fibroids, menorrhagia, pelvic pain POSTOPERATIVE DIAGNOSIS:  Symptomatic fibroids, menorrhagia, suspected endometriosis, hydrosalpinx, pelvic adhesions SURGEON:   Benjaman Kindler, MD. ASSISTANT: Laverta Baltimore, M.D. ANESTHESIOLOGIST: Alvin Critchley, MD Anesthesiologist: Alvin Critchley, MD CRNA: Demetrius Charity, CRNA; Aline Brochure, CRNA  OPERATION:  Total abdominal hysterectomy, left Salpingectomy and right fibrectomy, Lysis of adhesions for a significant portion of the case ANESTHESIA:  General endotracheal.  INDICATIONS: The patient is a 45 y.o. G2P0020 with the aforementioned diagnoses who desires definitive surgical management. She has a hx of pelvic pain that is constant, pelvic pressure, heavy menstrual bleeding. On the preoperative visit, the risks, benefits, indications, and alternatives of the procedure were reviewed with the patient.  On the day of surgery, the risks of surgery were again discussed with the patient including but not limited to: bleeding which may require transfusion or reoperation; infection which may require antibiotics; injury to bowel, bladder, ureters or other surrounding organs; need for additional procedures; thromboembolic phenomenon, incisional problems and other postoperative/anesthesia complications. Written informed consent was obtained.    OPERATIVE FINDINGS: A 6 week size uterus with a large posterior fibroid extending to the posterior cervix and into the left broad ligament laterally. Bilaterally the ovaries and Fallopian tubes were adherent to the pelvic sidewall. Both tubes were wrapped posteriorly with the fimbriae obscured behind adhesions.  Total specimen weight: 461g   ESTIMATED BLOOD LOSS: 300 ml FLUIDS:  1500 ml of Lactated Ringers URINE OUTPUT:  300 ml of clear yellow urine. SPECIMENS:  Uterus,cervix,  Left fallopian tube and right fimbria sent to  pathology COMPLICATIONS:  None immediate.   DESCRIPTION OF PROCEDURE:  The patient received intravenous antibiotics and had sequential compression devices applied to her lower extremities while in the preoperative area.   She was taken to the operating room and placed under general anesthesia without difficulty.The abdomen and perineum were prepped and draped in a sterile manner, and she was placed in a dorsal lithotomy position.  A Foley catheter was inserted into the bladder and attached to constant drainage. A sponge stick was placed in the vagina to assist with identifying the cervicovaginal junction.   After an adequate timeout was performed, a Pfannensteil skin incision was made. This incision was taken down to the fascia sharply with care given to maintain good hemostasis. The fascia was incised in the midline and the fascial incision was then extended bilaterally using electrocautery without difficulty. The fascia was then dissected off the underlying rectus muscles using blunt and sharp dissection. The rectus muscles were split bluntly in the midline and the peritoneum entered sharply without complication. This peritoneal incision was then extended superiorly and inferiorly with care given to prevent bowel or bladder injury. Attention was then turned to the pelvis.   A balfour retractor was placed into the incision, and the bowel was packed away with moist laparotomy sponges. The uterus at this point was noted to be mobilized and was delivered up out of the abdomen.  The round ligaments on each side were clamped, suture ligated with 0 Vicryl, and transected with electrocautery allowing entry into the broad ligament. Of note, all sutures used in this procedure are 0 Vicryl unless otherwise noted.   The anterior and posterior leaves of the broad ligament were separated, and the ureters were inspected to be safely away from the area of dissection bilaterally.  Adnexae were clamped on the patient's  right side, cut, and doubly suture ligated. This procedure was  repeated in an identical fashion on the left site allowing for both adnexa to remain in place.     A bladder flap was then created.  The bladder was then bluntly dissected off the lower uterine segment and cervix with good hemostasis noted. The uterine arteries were then skeletonized on the right and then clamped, cut, and doubly suture ligated with care given to prevent ureteral injury.  The uterus was then amputated across the lower uterine segment leaving the cervix intact to allow for visualization of the posterior fibroid. The fibroid was enucleated with minimal blood loss, allowing the remaining portion of the cervix to be divested of its blood supply. The uterosacral ligaments were intact on the right, but not palpable on the left. The remaining enlarged peritoneum that held the fibroid was made hemostatic with electrocautery.  Acutely curved clamps were placed across the vagina just under the cervix, and the specimen was amputated and sent to pathology. The vaginal cuff angles were closed with Heaney stiches with care given to incorporate the uterosacral-cardinal ligament pedicles on the right side, and the best guess for vaginal suspension made on the left. The middle of the vaginal cuff was closed with a series of interrupted figure-of-eight sutures with care given to incorporate the anterior pubocervical fascia and the posterior rectovaginal fascia.   The pelvis was irrigated and hemostasis was reconfirmed at all pedicles and along the pelvic sidewall.  The ureters were inspected and noted to be peristalsing bilaterally.    Attention was turned to the adnexa, and the left fallopian lysed from its surrounding adhesions, being careful to leave the IP ligament and nearby bowel intact. Most of this tube and all of its fimbrea were able to be excised. On the right, only the fimbria were able to be removed safely because of the surrounding  adhesions. However, both ureters were noted to be away from the operative surfaces and both peristalsing appropriately (not overactive) at the end of the case.   Reinspection of all surfaces noted excellent hemostasis.    All laparotomy sponges and instruments were removed from the abdomen. The fascia was also closed in a running fashion. The subcutaneous layer was reapproximated with 2-0 Vicryl. The skin was closed with a 3-0 Monocryl subcuticular stitch. Sponge, lap, needle, and instrument counts were correct times two. The patient was taken to the recovery area awake, extubated and in stable condition.

## 2016-01-15 NOTE — Interval H&P Note (Signed)
History and Physical Interval Note:  01/15/2016 7:33 AM  Tamara Thomas  has presented today for surgery, with the diagnosis of Pelvic pain, Abnormal uterine bleeding  The various methods of treatment have been discussed with the patient and family. After consideration of risks, benefits and other options for treatment, the patient has consented to  Procedure(s): HYSTERECTOMY ABDOMINAL WITH SALPINGECTOMY (Bilateral) as a surgical intervention .  The patient's history has been reviewed, patient examined, no change in status, stable for surgery.  I have reviewed the patient's chart and labs.  Questions were answered to the patient's satisfaction.     Benjaman Kindler

## 2016-01-15 NOTE — Anesthesia Procedure Notes (Signed)
Procedure Name: Intubation Performed by: Demetrius Charity Pre-anesthesia Checklist: Patient identified, Patient being monitored, Timeout performed, Emergency Drugs available and Suction available Patient Re-evaluated:Patient Re-evaluated prior to inductionOxygen Delivery Method: Circle system utilized Preoxygenation: Pre-oxygenation with 100% oxygen Intubation Type: IV induction Ventilation: Mask ventilation without difficulty Laryngoscope Size: 3 and Glidescope Grade View: Grade I Tube type: Oral Tube size: 7.0 mm Number of attempts: 1 Airway Equipment and Method: Rigid stylet,  Video-laryngoscopy and Patient positioned with wedge pillow Placement Confirmation: ETT inserted through vocal cords under direct vision,  positive ETCO2 and breath sounds checked- equal and bilateral Secured at: 21 cm Tube secured with: Tape Dental Injury: Teeth and Oropharynx as per pre-operative assessment  Difficulty Due To: Difficulty was anticipated and Difficult Airway- due to anterior larynx Future Recommendations: Recommend- induction with short-acting agent, and alternative techniques readily available

## 2016-01-15 NOTE — Anesthesia Preprocedure Evaluation (Signed)
Anesthesia Evaluation  Patient identified by MRN, date of birth, ID band Patient awake    Reviewed: Allergy & Precautions, NPO status , Patient's Chart, lab work & pertinent test results  Airway Mallampati: III  TM Distance: <3 FB Neck ROM: Full    Dental  (+) Chipped   Pulmonary Current Smoker,    Pulmonary exam normal        Cardiovascular hypertension, Pt. on medications Normal cardiovascular exam     Neuro/Psych Anxiety Depression Bipolar Disorder negative neurological ROS     GI/Hepatic Neg liver ROS, GERD  Medicated and Controlled,  Endo/Other  negative endocrine ROS  Renal/GU negative Renal ROS     Musculoskeletal Restless leg   Abdominal Normal abdominal exam  (+)   Peds negative pediatric ROS (+)  Hematology negative hematology ROS (+)   Anesthesia Other Findings   Reproductive/Obstetrics                             Anesthesia Physical Anesthesia Plan  ASA: II  Anesthesia Plan: General   Post-op Pain Management:    Induction: Intravenous  Airway Management Planned: Oral ETT  Additional Equipment:   Intra-op Plan:   Post-operative Plan: Extubation in OR  Informed Consent: I have reviewed the patients History and Physical, chart, labs and discussed the procedure including the risks, benefits and alternatives for the proposed anesthesia with the patient or authorized representative who has indicated his/her understanding and acceptance.   Dental advisory given  Plan Discussed with: CRNA and Surgeon  Anesthesia Plan Comments:         Anesthesia Quick Evaluation

## 2016-01-15 NOTE — Progress Notes (Signed)
Day of Surgery Subjective: The patient is doing well.  No nausea or vomiting. Pain is adequately controlled. On PCA. Tolerating clears. Pain was difficult to control earlier but now is well.  Objective: Vital signs in last 24 hours: Temp:  [98.1 F (36.7 C)-98.7 F (37.1 C)] 98.7 F (37.1 C) (04/24 1520) Pulse Rate:  [78-98] 78 (04/24 1520) Resp:  [8-20] 15 (04/24 1625) BP: (111-143)/(64-84) 127/70 mmHg (04/24 1520) SpO2:  [93 %-100 %] 98 % (04/24 1625) Weight:  [92.987 kg (205 lb)] 92.987 kg (205 lb) (04/24 0617)  Intake/Output  Intake/Output Summary (Last 24 hours) at 01/15/16 1933 Last data filed at 01/15/16 1521  Gross per 24 hour  Intake   1600 ml  Output   1270 ml  Net    330 ml    Physical Exam:  General: Alert and oriented. CV: RRR Lungs: Clear bilaterally. GI: Soft, Nondistended. Incisions: Clean and dry. Urine: Clear, Foley in place Extremities: Nontender, no erythema, no edema.  Lab Results:  Recent Labs  01/15/16 1422  HGB 10.8*  HCT 33.6*  WBC 20.5*  PLT 400                 Results for orders placed or performed during the hospital encounter of 01/15/16 (from the past 24 hour(s))  Pregnancy, urine POC     Status: None   Collection Time: 01/15/16  7:03 AM  Result Value Ref Range   Preg Test, Ur NEGATIVE NEGATIVE  CBC     Status: Abnormal   Collection Time: 01/15/16  2:22 PM  Result Value Ref Range   WBC 20.5 (H) 3.6 - 11.0 K/uL   RBC 4.25 3.80 - 5.20 MIL/uL   Hemoglobin 10.8 (L) 12.0 - 16.0 g/dL   HCT 33.6 (L) 35.0 - 47.0 %   MCV 79.0 (L) 80.0 - 100.0 fL   MCH 25.4 (L) 26.0 - 34.0 pg   MCHC 32.1 32.0 - 36.0 g/dL   RDW 18.2 (H) 11.5 - 14.5 %   Platelets 400 150 - 440 K/uL  Basic metabolic panel     Status: Abnormal   Collection Time: 01/15/16  2:22 PM  Result Value Ref Range   Sodium 136 135 - 145 mmol/L   Potassium 4.1 3.5 - 5.1 mmol/L   Chloride 103 101 - 111 mmol/L   CO2 24 22 - 32 mmol/L   Glucose, Bld 145 (H) 65 - 99 mg/dL   BUN  8 6 - 20 mg/dL   Creatinine, Ser 0.79 0.44 - 1.00 mg/dL   Calcium 8.5 (L) 8.9 - 10.3 mg/dL   GFR calc non Af Amer >60 >60 mL/min   GFR calc Af Amer >60 >60 mL/min   Anion gap 9 5 - 15    Assessment/Plan: POD# 0 s/p TAH/BS, lysis of adhesions.  1) Ambulate, Incentive spirometry PRN in am 2) Advance diet as tolerated 3) Transition to oral pain medication tomorrow planed. Continue PCA overnight. 4) D/C Foley in am 5) Discharge pending 6. Continue SCDs   Benjaman Kindler, MD   LOS: 0 days   Benjaman Kindler 01/15/2016, 7:33 PM

## 2016-01-15 NOTE — Transfer of Care (Signed)
Immediate Anesthesia Transfer of Care Note  Patient: Tamara Thomas  Procedure(s) Performed: Procedure(s): HYSTERECTOMY ABDOMINAL WITH SALPINGECTOMY  (Bilateral)  Patient Location: PACU  Anesthesia Type:General  Level of Consciousness: awake, alert  and oriented  Airway & Oxygen Therapy: Pt spontaneously breathing and connected to facemask oxygen  Post-op Assessment: Report given to RN and Post -op Vital signs reviewed and stable  Post vital signs: Reviewed and stable  Last Vitals:  Filed Vitals:   01/15/16 0617  BP: 111/74  Pulse: 80  Temp: 36.7 C  Resp: 16    Complications: No apparent anesthesia complications

## 2016-01-16 LAB — CBC WITH DIFFERENTIAL/PLATELET
Basophils Absolute: 0.1 10*3/uL (ref 0–0.1)
Basophils Relative: 1 %
EOS ABS: 0.3 10*3/uL (ref 0–0.7)
EOS PCT: 2 %
HCT: 30.5 % — ABNORMAL LOW (ref 35.0–47.0)
HEMOGLOBIN: 9.8 g/dL — AB (ref 12.0–16.0)
LYMPHS ABS: 3.3 10*3/uL (ref 1.0–3.6)
Lymphocytes Relative: 23 %
MCH: 25.7 pg — AB (ref 26.0–34.0)
MCHC: 32.3 g/dL (ref 32.0–36.0)
MCV: 79.6 fL — ABNORMAL LOW (ref 80.0–100.0)
MONO ABS: 1.2 10*3/uL — AB (ref 0.2–0.9)
MONOS PCT: 8 %
Neutro Abs: 9.5 10*3/uL — ABNORMAL HIGH (ref 1.4–6.5)
Neutrophils Relative %: 66 %
PLATELETS: 431 10*3/uL (ref 150–440)
RBC: 3.83 MIL/uL (ref 3.80–5.20)
RDW: 18.4 % — AB (ref 11.5–14.5)
WBC: 14.3 10*3/uL — ABNORMAL HIGH (ref 3.6–11.0)

## 2016-01-16 LAB — COMPREHENSIVE METABOLIC PANEL
ALK PHOS: 123 U/L (ref 38–126)
ALT: 76 U/L — ABNORMAL HIGH (ref 14–54)
ANION GAP: 8 (ref 5–15)
AST: 66 U/L — ABNORMAL HIGH (ref 15–41)
Albumin: 3.6 g/dL (ref 3.5–5.0)
BUN: 8 mg/dL (ref 6–20)
CALCIUM: 8.7 mg/dL — AB (ref 8.9–10.3)
CO2: 28 mmol/L (ref 22–32)
Chloride: 103 mmol/L (ref 101–111)
Creatinine, Ser: 0.72 mg/dL (ref 0.44–1.00)
GFR calc non Af Amer: >60 mL/min (ref >60)
Glucose, Bld: 94 mg/dL (ref 65–99)
POTASSIUM: 3.9 mmol/L (ref 3.5–5.1)
SODIUM: 139 mmol/L (ref 135–145)
Total Bilirubin: 0.4 mg/dL (ref 0.3–1.2)
Total Protein: 7.1 g/dL (ref 6.5–8.1)

## 2016-01-16 MED ORDER — SIMETHICONE 80 MG PO CHEW
80.0000 mg | CHEWABLE_TABLET | Freq: Four times a day (QID) | ORAL | Status: DC | PRN
  Administered 2016-01-16 (×2): 80 mg via ORAL
  Filled 2016-01-16: qty 1

## 2016-01-16 MED ORDER — HYDROMORPHONE HCL 1 MG/ML IJ SOLN: 0.5000 mg | INTRAMUSCULAR | Status: DC | PRN

## 2016-01-16 MED ORDER — PRAMIPEXOLE DIHYDROCHLORIDE 0.25 MG PO TABS
0.5000 mg | ORAL_TABLET | Freq: Every evening | ORAL | Status: DC
  Administered 2016-01-16: 0.375 mg via ORAL
  Filled 2016-01-16: qty 2

## 2016-01-16 MED ORDER — HYDROMORPHONE HCL 2 MG PO TABS
2.0000 mg | ORAL_TABLET | ORAL | Status: DC | PRN
  Administered 2016-01-16 – 2016-01-17 (×3): 2 mg via ORAL
  Filled 2016-01-16 (×3): qty 1

## 2016-01-16 NOTE — Anesthesia Postprocedure Evaluation (Signed)
Anesthesia Post Note  Patient: Tamara Thomas  Procedure(s) Performed: Procedure(s) (LRB): HYSTERECTOMY ABDOMINAL WITH SALPINGECTOMY  (Bilateral)  Patient location during evaluation: PACU Anesthesia Type: General Level of consciousness: awake and alert and oriented Pain management: pain level controlled Vital Signs Assessment: post-procedure vital signs reviewed and stable Respiratory status: spontaneous breathing Cardiovascular status: blood pressure returned to baseline Anesthetic complications: no    Last Vitals:  Filed Vitals:   01/16/16 0747 01/16/16 0759  BP: 91/59   Pulse: 75   Temp: 36.7 C   Resp: 18 11    Last Pain:  Filed Vitals:   01/16/16 0801  PainSc: 1                  Kyla Duffy

## 2016-01-16 NOTE — Progress Notes (Addendum)
1 Day Post-Op Procedure(s) (LRB): HYSTERECTOMY ABDOMINAL WITH SALPINGECTOMY  (Bilateral)  Subjective: Patient reports tolerating PO and + flatus.  Foley out.Tolerating regular diet.  Objective: I have reviewed patient's vital signs, intake and output, medications and labs.  General: alert, cooperative and no distress Resp: clear to auscultation bilaterally Cardio: regular rate and rhythm, S1, S2 normal, no murmur, click, rub or gallop GI: soft, non-tender; bowel sounds normal; no masses,  no organomegaly and appropriately tender Extremities: extremities normal, atraumatic, no cyanosis or edema and Homans sign is negative, no sign of DVT Vaginal Bleeding: minimal  Assessment: s/p Procedure(s): HYSTERECTOMY ABDOMINAL WITH SALPINGECTOMY  (Bilateral): stable  Plan: Encourage ambulation Advance to PO medication Encourage IS, continue SCD ppx until ambulating  LOS: 1 day   Due to void   Tamara Thomas, Tamara Thomas 01/16/2016, 8:20 AM

## 2016-01-16 NOTE — Progress Notes (Signed)
Patient ID: Tamara Thomas, female   DOB: 08/13/1971, 45 y.o.   MRN: [id number] Evluation of wound appropriate. Good bowel sounds, no fluid wave. Pt ambulating, tolerating po and passing flatus. No evidence of infection. Some bleeding earlier at incision now resolved. Some early ecchymosis.

## 2016-01-17 MED ORDER — IBUPROFEN 600 MG PO TABS: 600.0000 mg | ORAL_TABLET | Freq: Four times a day (QID) | ORAL | Status: DC

## 2016-01-17 MED ORDER — ONDANSETRON 4 MG PO TBDP: 4.0000 mg | ORAL_TABLET | Freq: Four times a day (QID) | ORAL | Status: DC | PRN

## 2016-01-17 MED ORDER — SIMETHICONE 80 MG PO CHEW: 80.0000 mg | CHEWABLE_TABLET | Freq: Four times a day (QID) | ORAL | Status: DC | PRN

## 2016-01-17 MED ORDER — DOCUSATE SODIUM 100 MG PO CAPS: 100.0000 mg | ORAL_CAPSULE | Freq: Two times a day (BID) | ORAL | Status: DC

## 2016-01-17 MED ORDER — HYDROMORPHONE HCL 2 MG PO TABS: 1.0000 mg | ORAL_TABLET | ORAL | Status: DC | PRN

## 2016-01-17 NOTE — Progress Notes (Signed)
Discharge order from MD. Reviewed discharge instructions and prescriptions (Dilaudid) with patient and answered any questions. Patient verbalized understanding of discharge instructions. Patient discharged home via ambulatory by nursing staff.    Hilbert Bible, Student RN

## 2016-01-17 NOTE — Discharge Summary (Signed)
Physician Discharge Summary  Patient ID: Tamara Thomas MRN: [id number] DOB/AGE: August 05, 1971 45 y.o.  Admit date: 01/15/2016 Discharge date: 01/17/2016  Admission Diagnoses:  Discharge Diagnoses:  Active Problems:   Fibroid uterus AUB Pelvic pain  Discharged Condition: good  Hospital Course: s/p TAH/BS for fibroid, AUB, pelvic pain. By POD#2, ambulating without support, voiding, tolerating regular po, pain controlled with po meds and asking to be discharged.  Consults: None  Significant Diagnostic Studies: Hgb 9, WBC 14  Treatments: IV hydration and surgery: as above  Discharge Exam: Blood pressure 114/77, pulse 71, temperature 98.3 F (36.8 C), temperature source Oral, resp. rate 20, height 5\' 3"  (1.6 m), weight 92.987 kg (205 lb), last menstrual period 12/25/2015, SpO2 97 %. General appearance: alert, cooperative and no distress Resp: clear to auscultation bilaterally Cardio: regular rate and rhythm, S1, S2 normal, no murmur, click, rub or gallop GI: soft, non-tender; bowel sounds normal; no masses,  no organomegaly Extremities: extremities normal, atraumatic, no cyanosis or edema Pulses: 2+ and symmetric Skin: Skin color, texture, turgor normal. No rashes or lesions   Incision c/d/i Appropriately tender  Disposition: Final discharge disposition not confirmed     Medication List    TAKE these medications        cyclobenzaprine 10 MG tablet  Commonly known as:  FLEXERIL  TK 1 T PO Q 8 H PRN FOR MUSCLE CRAMPING     DEXILANT 60 MG capsule  Generic drug:  dexlansoprazole  60 mg as needed.     docusate sodium 100 MG capsule  Commonly known as:  COLACE  Take 1 capsule (100 mg total) by mouth 2 (two) times daily.     gabapentin 800 MG tablet  Commonly known as:  NEURONTIN  Reported on 01/15/2016     hydrochlorothiazide 12.5 MG tablet  Commonly known as:  HYDRODIURIL  daily in am.     HYDROmorphone 2 MG tablet  Commonly known as:  DILAUDID  Take  0.5-1 tablets (1-2 mg total) by mouth every 4 (four) hours as needed for moderate pain or severe pain.     ibuprofen 600 MG tablet  Commonly known as:  ADVIL,MOTRIN  Take 1 tablet (600 mg total) by mouth 4 (four) times daily.     IRON COMPLEX PO  Take 1 tablet by mouth every morning.     losartan 100 MG tablet  Commonly known as:  COZAAR  TK 1 T PO QD bedtime.     metoCLOPramide 10 MG tablet  Commonly known as:  REGLAN  TK 1 T PO WITH MEALS PRN.     multivitamin tablet  Take 1 tablet by mouth every morning.     NEUPRO 4 MG/24HR  Generic drug:  rotigotine  UNW AND APP 1 PA TO  SKIN QD PRF RESTLESS LEGS.     ondansetron 4 MG disintegrating tablet  Commonly known as:  ZOFRAN ODT  Take 1 tablet (4 mg total) by mouth every 6 (six) hours as needed for nausea.     POTASSIUM CHLORIDE ER PO  Take 1 tablet by mouth every morning.     pramipexole 0.5 MG tablet  Commonly known as:  MIRAPEX  every evening.     QUEtiapine 25 MG tablet  Commonly known as:  SEROQUEL  Take 1 tablet (25 mg total) by mouth 4 (four) times daily.     simethicone 80 MG chewable tablet  Commonly known as:  MYLICON  Chew 1 tablet (80 mg total) by mouth 4 (four)  times daily as needed for flatulence.     venlafaxine XR 75 MG 24 hr capsule  Commonly known as:  EFFEXOR-XR  Take 1 capsule (75 mg total) by mouth daily with breakfast.       Follow-up Information    Follow up with Benjaman Kindler, MD In 2 weeks.   Specialty:  Obstetrics and Gynecology   Why:  For postop check   Contact information:   [address] [place] [address] [phone]       Signed: Benjaman Kindler 01/17/2016, 8:38 AM

## 2016-01-17 NOTE — Discharge Instructions (Signed)
Signs and Symptoms to Report °Call our office at [phone] if you have any of the following. ° °• Fever over 100.4 degrees or higher °• Severe stomach pain not relieved with pain medications °• Bright red bleeding that’s heavier than a period that does not slow with rest °• To go the bathroom a lot (frequency), you can’t hold your urine (urgency), or it hurts when you empty your bladder (urinate) °• Chest pain °• Shortness of breath °• Pain in the calves of your legs °• Severe nausea and vomiting not relieved with anti-nausea medications °• Signs of infection around your wounds, such as redness, hot to touch, swelling, green/yellow drainage (like pus), bad smelling discharge °• Any concerns ° °What You Can Expect after Surgery °• You may see some pink tinged, bloody fluid and bruising around the wound. This is normal. °• You may have a sore throat because of the tube in your mouth during general anesthesia. This will go away in 2 to 3 days. °• You may have some stomach cramps. °• You may notice spotting on your panties. °• You may have pain around the incision sites. ° ° °Activities after Your Discharge °Follow these guidelines to help speed your recovery at home: °• Do the coughing and deep breathing as you did in the hospital for 2 weeks. Use the small blue breathing device, called the incentive spirometer for 2 weeks. °• Don’t drive if you are in pain or taking narcotic pain medicine. You may drive when you can safely slam on the brakes, turn the wheel forcefully, and rotate your torso comfortably. This is typically 1-2 weeks. Practice in a parking lot or side street prior to attempting to drive regularly.  °• Ask others to help with household chores for 4 weeks. °• Do not lift anything heavier that 10 pounds for 4-6 weeks. This includes pets, children, and groceries. °• Don’t do strenuous activities, exercises, or sports like vacuuming, tennis, squash, etc. until your doctor says it is safe to do so. °---If  you had a hysterectomy (abdominal, laparoscopic, or vaginal) do not have intercourse for 8-10 weeks.  °• Walk as you feel able. Rest often since it may take two or three weeks for your energy level to return to normal.  °• You may climb stairs °• Avoid constipation: °  -Eat fruits, vegetables, and whole grains. Eat small meals as your appetite will take time to return to normal. °  -Drink 6 to 8 glasses of water each day unless your doctor has told you to limit your fluids. °  -Use a laxative or stool softener as needed if constipation becomes a problem. You may take Miralax, metamucil, Citrucil, Colace, Senekot, FiberCon, etc. If this does not relieve the constipation, try two tablespoons of Milk Of Magnesia every 8 hours until your bowels move.  °• You may shower. Gently wash the wounds with a mild soap and water. Pat dry. °• Do not get in a hot tub, swimming pool, etc. until your doctor agrees. °• Do not use lotions, oils, powders on the wounds. °• Do not douche, use tampons, or have sex until your doctor says it is okay. °• Take your pain medicine when you need it. The medicine may not work as well if the pain is bad. ° °Take the medicines you were taking before surgery. Other medications you will need are pain medications (Norco or Percocet) and nausea medications (Zofran).  ° °

## 2016-01-17 NOTE — Progress Notes (Signed)
I concur with the above documentation from Hardie Pulley.

## 2016-01-23 LAB — SURGICAL PATHOLOGY

## 2016-02-21 DIAGNOSIS — M79A22 Nontraumatic compartment syndrome of left lower extremity: Secondary | ICD-10-CM | POA: Insufficient documentation

## 2016-02-21 DIAGNOSIS — M79A21 Nontraumatic compartment syndrome of right lower extremity: Secondary | ICD-10-CM | POA: Insufficient documentation

## 2016-02-27 ENCOUNTER — Ambulatory Visit: Payer: 59 | Admitting: Psychiatry

## 2016-03-05 ENCOUNTER — Ambulatory Visit: Payer: 59 | Admitting: Psychiatry

## 2016-03-07 DIAGNOSIS — Z9289 Personal history of other medical treatment: Secondary | ICD-10-CM | POA: Insufficient documentation

## 2016-03-14 ENCOUNTER — Ambulatory Visit: Payer: 59 | Admitting: Psychiatry

## 2016-04-30 ENCOUNTER — Other Ambulatory Visit: Payer: Self-pay | Admitting: Psychiatry

## 2016-04-30 NOTE — Telephone Encounter (Signed)
pt called states she out of medications.  pt made an appt for 05-15-16. can she get enough medications to last until her appt.

## 2016-05-01 NOTE — Telephone Encounter (Signed)
request from pharmacy for a refill on the quetiapine fumarate 25mg . Pt has appt 05-15-16 , but patient has canceled last three appt.

## 2016-05-15 ENCOUNTER — Encounter: Payer: Self-pay | Admitting: Psychiatry

## 2016-05-15 ENCOUNTER — Ambulatory Visit (INDEPENDENT_AMBULATORY_CARE_PROVIDER_SITE_OTHER): Payer: 59 | Admitting: Psychiatry

## 2016-05-15 VITALS — BP 133/84 | HR 98 | Resp 18 | Ht 64.25 in | Wt 205.0 lb

## 2016-05-15 DIAGNOSIS — F3161 Bipolar disorder, current episode mixed, mild: Secondary | ICD-10-CM

## 2016-05-15 MED ORDER — ALPRAZOLAM 0.25 MG PO TABS: 0.2500 mg | ORAL_TABLET | Freq: Every evening | ORAL | 1 refills | Status: DC | PRN

## 2016-05-15 MED ORDER — QUETIAPINE FUMARATE 25 MG PO TABS: 25.0000 mg | ORAL_TABLET | Freq: Four times a day (QID) | ORAL | 3 refills | Status: DC

## 2016-05-15 MED ORDER — VENLAFAXINE HCL ER 75 MG PO CP24: 75.0000 mg | ORAL_CAPSULE | Freq: Every day | ORAL | 1 refills | Status: DC
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
## 2016-06-09 ENCOUNTER — Other Ambulatory Visit: Payer: Self-pay | Admitting: Psychiatry

## 2016-06-12 DIAGNOSIS — M79672 Pain in left foot: Secondary | ICD-10-CM

## 2016-06-12 DIAGNOSIS — M79671 Pain in right foot: Secondary | ICD-10-CM | POA: Insufficient documentation

## 2016-06-12 DIAGNOSIS — G44229 Chronic tension-type headache, not intractable: Secondary | ICD-10-CM | POA: Insufficient documentation

## 2016-06-17 ENCOUNTER — Telehealth (HOSPITAL_COMMUNITY): Payer: Self-pay

## 2016-06-17 NOTE — Telephone Encounter (Signed)
She takes seroquel 25mg  qam and 75 mg po qhs. Please dispense as written

## 2016-06-17 NOTE — Telephone Encounter (Signed)
Fax received from patients pharmacy for a refill on Quetiapine 25 mg 1 po 4 times a day. This is a patient of Dr. Gretel Acre. Please review and advise, thank you

## 2016-06-21 NOTE — Telephone Encounter (Signed)
Called patients pharmacy - this was actually sent in on 05/15/2016 with 3 refills - patient should not need a refill at this time.

## 2016-07-22 DIAGNOSIS — F17200 Nicotine dependence, unspecified, uncomplicated: Secondary | ICD-10-CM | POA: Insufficient documentation

## 2016-07-22 DIAGNOSIS — Z7185 Encounter for immunization safety counseling: Secondary | ICD-10-CM | POA: Insufficient documentation

## 2016-07-22 DIAGNOSIS — Z7189 Other specified counseling: Secondary | ICD-10-CM | POA: Insufficient documentation

## 2016-07-29 DIAGNOSIS — Z862 Personal history of diseases of the blood and blood-forming organs and certain disorders involving the immune mechanism: Secondary | ICD-10-CM | POA: Insufficient documentation

## 2016-07-29 DIAGNOSIS — R7303 Prediabetes: Secondary | ICD-10-CM | POA: Insufficient documentation

## 2016-09-27 ENCOUNTER — Other Ambulatory Visit: Payer: Self-pay | Admitting: Family Medicine

## 2016-09-27 DIAGNOSIS — Z1231 Encounter for screening mammogram for malignant neoplasm of breast: Secondary | ICD-10-CM

## 2016-10-16 ENCOUNTER — Telehealth: Payer: Self-pay

## 2016-10-16 NOTE — Telephone Encounter (Signed)
Not seen since August. Please ask her to make appt.

## 2016-10-16 NOTE — Telephone Encounter (Signed)
pt states she needs a rx for xanax she states she has an appt for 10-29-16 but that she has been under alot of stress and having panic attacks.

## 2016-10-24 NOTE — Telephone Encounter (Signed)
pt does have an appt to come in but she needs enough medication to get to appt .

## 2016-10-25 ENCOUNTER — Telehealth: Payer: Self-pay

## 2016-10-25 NOTE — Telephone Encounter (Signed)
pt called states that she needs you to call her.  her father is ill and is not expect to live and she needs something for her nerves.  she not sleeping either.  please call.

## 2016-10-25 NOTE — Telephone Encounter (Signed)
Return phone call to the patient. She was not seen since August. Patient reported that she is ready to fly to [place] tomorrow as her father-in-law is sick. She wants to come into the office to pick up  prescription for Xanax. Advised patient that she will not be prescribed Xanax at this time but she can be given hydroxyzine to help her sleep at night. She became upset and agitated and reported that why she cannot get Xanax at this time. She wanted to get appointment at this time. Advised patient that we have already seen patients for today and she has to make appointment for next week or after her return from [place]. She hung up the phone on me.

## 2016-10-28 ENCOUNTER — Other Ambulatory Visit: Payer: Self-pay | Admitting: Psychiatry

## 2016-10-28 ENCOUNTER — Ambulatory Visit: Payer: Managed Care, Other (non HMO)

## 2016-10-29 ENCOUNTER — Ambulatory Visit: Payer: 59 | Admitting: Psychiatry

## 2016-11-25 DIAGNOSIS — M2242 Chondromalacia patellae, left knee: Secondary | ICD-10-CM | POA: Insufficient documentation

## 2016-12-04 ENCOUNTER — Encounter: Payer: Self-pay | Admitting: Psychiatry

## 2016-12-04 ENCOUNTER — Ambulatory Visit (INDEPENDENT_AMBULATORY_CARE_PROVIDER_SITE_OTHER): Payer: 59 | Admitting: Psychiatry

## 2016-12-04 VITALS — BP 144/84 | HR 89 | Wt 204.4 lb

## 2016-12-04 DIAGNOSIS — F3160 Bipolar disorder, current episode mixed, unspecified: Secondary | ICD-10-CM

## 2016-12-04 MED ORDER — ALPRAZOLAM 0.25 MG PO TABS: 0.2500 mg | ORAL_TABLET | Freq: Every evening | ORAL | 0 refills | Status: DC | PRN

## 2016-12-04 MED ORDER — DIVALPROEX SODIUM ER 250 MG PO TB24: 250.0000 mg | ORAL_TABLET | Freq: Every day | ORAL | 1 refills | Status: DC

## 2016-12-04 MED ORDER — DULOXETINE HCL 30 MG PO CPEP: 30.0000 mg | ORAL_CAPSULE | Freq: Every day | ORAL | 1 refills | Status: DC
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
## 2017-01-20 ENCOUNTER — Ambulatory Visit
Admission: RE | Admit: 2017-01-20 | Discharge: 2017-01-20 | Disposition: A | Discharge status: Home / Self Care | Payer: Managed Care, Other (non HMO) | Source: Ambulatory Visit | Attending: Family Medicine | Admitting: Family Medicine

## 2017-01-20 DIAGNOSIS — Z1231 Encounter for screening mammogram for malignant neoplasm of breast: Secondary | ICD-10-CM | POA: Diagnosis not present

## 2017-01-29 ENCOUNTER — Other Ambulatory Visit: Payer: Self-pay | Admitting: *Deleted

## 2017-01-29 ENCOUNTER — Inpatient Hospital Stay
Admission: RE | Admit: 2017-01-29 | Discharge: 2017-01-29 | Disposition: A | Discharge status: Home / Self Care | Payer: Self-pay | Source: Ambulatory Visit | Attending: *Deleted | Admitting: *Deleted

## 2017-01-29 DIAGNOSIS — Z9289 Personal history of other medical treatment: Secondary | ICD-10-CM

## 2017-10-21 DIAGNOSIS — G4700 Insomnia, unspecified: Secondary | ICD-10-CM | POA: Diagnosis not present

## 2017-10-28 DIAGNOSIS — G479 Sleep disorder, unspecified: Secondary | ICD-10-CM | POA: Diagnosis not present

## 2017-10-28 DIAGNOSIS — G2581 Restless legs syndrome: Secondary | ICD-10-CM | POA: Diagnosis not present

## 2017-10-28 DIAGNOSIS — R51 Headache: Secondary | ICD-10-CM | POA: Diagnosis not present

## 2017-11-20 DIAGNOSIS — Z719 Counseling, unspecified: Secondary | ICD-10-CM | POA: Diagnosis not present

## 2017-11-24 DIAGNOSIS — M79A21 Nontraumatic compartment syndrome of right lower extremity: Secondary | ICD-10-CM | POA: Diagnosis not present

## 2017-11-24 DIAGNOSIS — M79671 Pain in right foot: Secondary | ICD-10-CM | POA: Diagnosis not present

## 2017-11-24 DIAGNOSIS — R259 Unspecified abnormal involuntary movements: Secondary | ICD-10-CM | POA: Diagnosis not present

## 2017-11-24 DIAGNOSIS — M79A22 Nontraumatic compartment syndrome of left lower extremity: Secondary | ICD-10-CM | POA: Diagnosis not present

## 2017-12-19 DIAGNOSIS — M79671 Pain in right foot: Secondary | ICD-10-CM | POA: Diagnosis not present

## 2017-12-19 DIAGNOSIS — M79673 Pain in unspecified foot: Secondary | ICD-10-CM | POA: Diagnosis not present

## 2017-12-19 DIAGNOSIS — M79672 Pain in left foot: Secondary | ICD-10-CM | POA: Diagnosis not present

## 2017-12-23 DIAGNOSIS — Z719 Counseling, unspecified: Secondary | ICD-10-CM | POA: Diagnosis not present

## 2017-12-29 DIAGNOSIS — J019 Acute sinusitis, unspecified: Secondary | ICD-10-CM | POA: Diagnosis not present

## 2018-02-04 DIAGNOSIS — E669 Obesity, unspecified: Secondary | ICD-10-CM | POA: Insufficient documentation

## 2018-02-04 DIAGNOSIS — I10 Essential (primary) hypertension: Secondary | ICD-10-CM | POA: Diagnosis not present

## 2018-02-04 DIAGNOSIS — R7303 Prediabetes: Secondary | ICD-10-CM | POA: Diagnosis not present

## 2018-03-11 DIAGNOSIS — G5792 Unspecified mononeuropathy of left lower limb: Secondary | ICD-10-CM | POA: Diagnosis not present

## 2018-03-11 DIAGNOSIS — G5791 Unspecified mononeuropathy of right lower limb: Secondary | ICD-10-CM | POA: Diagnosis not present

## 2018-04-01 DIAGNOSIS — M79672 Pain in left foot: Secondary | ICD-10-CM | POA: Diagnosis not present

## 2018-04-01 DIAGNOSIS — M79671 Pain in right foot: Secondary | ICD-10-CM | POA: Diagnosis not present

## 2018-04-27 DIAGNOSIS — G5791 Unspecified mononeuropathy of right lower limb: Secondary | ICD-10-CM | POA: Diagnosis not present

## 2018-04-27 DIAGNOSIS — G5792 Unspecified mononeuropathy of left lower limb: Secondary | ICD-10-CM | POA: Diagnosis not present

## 2018-06-17 ENCOUNTER — Other Ambulatory Visit: Payer: Self-pay | Admitting: Obstetrics and Gynecology

## 2018-06-17 ENCOUNTER — Other Ambulatory Visit: Payer: Self-pay

## 2018-06-17 ENCOUNTER — Encounter: Payer: Self-pay | Admitting: *Deleted

## 2018-06-17 ENCOUNTER — Observation Stay
Admission: EM | Admit: 2018-06-17 | Discharge: 2018-06-18 | Disposition: A | Discharge status: Home / Self Care | Payer: 59 | Attending: Obstetrics and Gynecology | Admitting: Obstetrics and Gynecology

## 2018-06-17 ENCOUNTER — Ambulatory Visit
Admission: RE | Admit: 2018-06-17 | Discharge: 2018-06-17 | Disposition: A | Discharge status: Home / Self Care | Payer: 59 | Source: Ambulatory Visit | Attending: Obstetrics and Gynecology | Admitting: Obstetrics and Gynecology

## 2018-06-17 ENCOUNTER — Ambulatory Visit: Payer: Self-pay

## 2018-06-17 DIAGNOSIS — F419 Anxiety disorder, unspecified: Secondary | ICD-10-CM | POA: Insufficient documentation

## 2018-06-17 DIAGNOSIS — K449 Diaphragmatic hernia without obstruction or gangrene: Secondary | ICD-10-CM | POA: Insufficient documentation

## 2018-06-17 DIAGNOSIS — I700 Atherosclerosis of aorta: Secondary | ICD-10-CM

## 2018-06-17 DIAGNOSIS — Z9884 Bariatric surgery status: Secondary | ICD-10-CM | POA: Diagnosis not present

## 2018-06-17 DIAGNOSIS — Z881 Allergy status to other antibiotic agents status: Secondary | ICD-10-CM | POA: Insufficient documentation

## 2018-06-17 DIAGNOSIS — R350 Frequency of micturition: Secondary | ICD-10-CM

## 2018-06-17 DIAGNOSIS — K219 Gastro-esophageal reflux disease without esophagitis: Secondary | ICD-10-CM | POA: Insufficient documentation

## 2018-06-17 DIAGNOSIS — D72829 Elevated white blood cell count, unspecified: Secondary | ICD-10-CM | POA: Insufficient documentation

## 2018-06-17 DIAGNOSIS — R1031 Right lower quadrant pain: Secondary | ICD-10-CM | POA: Diagnosis not present

## 2018-06-17 DIAGNOSIS — G2581 Restless legs syndrome: Secondary | ICD-10-CM | POA: Insufficient documentation

## 2018-06-17 DIAGNOSIS — Z90710 Acquired absence of both cervix and uterus: Secondary | ICD-10-CM

## 2018-06-17 DIAGNOSIS — N83202 Unspecified ovarian cyst, left side: Secondary | ICD-10-CM

## 2018-06-17 DIAGNOSIS — F17210 Nicotine dependence, cigarettes, uncomplicated: Secondary | ICD-10-CM | POA: Diagnosis not present

## 2018-06-17 DIAGNOSIS — R100 Acute abdomen: Secondary | ICD-10-CM

## 2018-06-17 DIAGNOSIS — R102 Pelvic and perineal pain: Secondary | ICD-10-CM | POA: Diagnosis not present

## 2018-06-17 DIAGNOSIS — F319 Bipolar disorder, unspecified: Secondary | ICD-10-CM | POA: Insufficient documentation

## 2018-06-17 DIAGNOSIS — N83201 Unspecified ovarian cyst, right side: Secondary | ICD-10-CM

## 2018-06-17 DIAGNOSIS — N838 Other noninflammatory disorders of ovary, fallopian tube and broad ligament: Secondary | ICD-10-CM | POA: Insufficient documentation

## 2018-06-17 DIAGNOSIS — I10 Essential (primary) hypertension: Secondary | ICD-10-CM | POA: Diagnosis not present

## 2018-06-17 DIAGNOSIS — Z79899 Other long term (current) drug therapy: Secondary | ICD-10-CM | POA: Diagnosis not present

## 2018-06-17 LAB — LIPASE, BLOOD: LIPASE: 30 U/L (ref 11–51)

## 2018-06-17 LAB — URINALYSIS, COMPLETE (UACMP) WITH MICROSCOPIC
BILIRUBIN URINE: NEGATIVE
Glucose, UA: NEGATIVE mg/dL
Hgb urine dipstick: NEGATIVE
KETONES UR: NEGATIVE mg/dL
Leukocytes, UA: NEGATIVE
NITRITE: NEGATIVE
PH: 6 (ref 5.0–8.0)
PROTEIN: NEGATIVE mg/dL
Specific Gravity, Urine: 1.027 (ref 1.005–1.030)

## 2018-06-17 LAB — COMPREHENSIVE METABOLIC PANEL
ALT: 23 U/L (ref 0–44)
ANION GAP: 9 (ref 5–15)
AST: 20 U/L (ref 15–41)
Albumin: 3.9 g/dL (ref 3.5–5.0)
Alkaline Phosphatase: 104 U/L (ref 38–126)
BILIRUBIN TOTAL: 0.3 mg/dL (ref 0.3–1.2)
BUN: 9 mg/dL (ref 6–20)
CHLORIDE: 106 mmol/L (ref 98–111)
CO2: 24 mmol/L (ref 22–32)
Calcium: 8.8 mg/dL — ABNORMAL LOW (ref 8.9–10.3)
Creatinine, Ser: 0.79 mg/dL (ref 0.44–1.00)
GFR calc Af Amer: >60 mL/min (ref >60)
Glucose, Bld: 110 mg/dL — ABNORMAL HIGH (ref 70–99)
POTASSIUM: 3.7 mmol/L (ref 3.5–5.1)
Sodium: 139 mmol/L (ref 135–145)
TOTAL PROTEIN: 7.7 g/dL (ref 6.5–8.1)

## 2018-06-17 LAB — CBC
HEMATOCRIT: 34.8 % — AB (ref 35.0–47.0)
HEMOGLOBIN: 12.5 g/dL (ref 12.0–16.0)
MCH: 30.5 pg (ref 26.0–34.0)
MCHC: 35.8 g/dL (ref 32.0–36.0)
MCV: 85.2 fL (ref 80.0–100.0)
Platelets: 427 10*3/uL (ref 150–440)
RBC: 4.08 MIL/uL (ref 3.80–5.20)
RDW: 15.1 % — ABNORMAL HIGH (ref 11.5–14.5)
WBC: 15.6 10*3/uL — AB (ref 3.6–11.0)

## 2018-06-17 LAB — POCT I-STAT CREATININE: CREATININE: 0.8 mg/dL (ref 0.44–1.00)

## 2018-06-17 MED ORDER — IOPAMIDOL (ISOVUE-300) INJECTION 61%
100.0000 mL | Freq: Once | INTRAVENOUS | Status: AC | PRN
  Administered 2018-06-17: 100 mL via INTRAVENOUS

## 2018-06-17 NOTE — ED Triage Notes (Signed)
Pt reports having an outpt ct scan today.  Pt was called and told to come to er for eval of abd pain.  No n/v.  Pt alert.

## 2018-06-17 NOTE — ED Provider Notes (Signed)
[facility] Emergency Department Provider Note  Time seen: 9:49 PM  I have reviewed the triage vital signs and the nursing notes.   HISTORY  Chief Complaint Abdominal Pain    HPI Tamara Thomas is a 47 y.o. female with a past medical history of anxiety, bipolar, gastric reflux, hypertension, presents to the emergency department for abnormal imaging results.  According to the patient she has been experiencing lower abdominal pain over the past several days.  Saw her OB/GYN who performed a pelvic exam for patient with no concerning findings.  Sent her for a CT scan which showed swelling and fluid around the right ovary.  Patient was sent for an ultrasound but did not know the results of the ultrasound was ultimately sent to the emergency department for evaluation.  Patient states 5/10 pain in the lower abdomen currently.  Dull aching type pain.  Denies any dysuria, hematuria.  Denies any vaginal discharge or bleeding.  No nausea vomiting or upper abdominal pain.  Negative for fever.  Past Medical History:  Diagnosis Date  . Anxiety   . Bipolar disorder ([place])   . Depression   . GERD (gastroesophageal reflux disease)   . Hypertension   . Restless leg syndrome     Patient Active Problem List   Diagnosis Date Noted  . 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 History of normocytic normochromic anemia 07/29/2016  . Borderline diabetes mellitus 07/29/2016  . Vaccine counseling 07/22/2016  . Tobacco dependence 07/22/2016  . Chronic tension-type headache, not intractable 06/12/2016  . Bilateral foot pain 06/12/2016  . History of EMG 03/07/2016  . Compartment syndrome of right lower extremity due to exertion 02/21/2016  . Compartment syndrome of left lower extremity due to exertion 02/21/2016  . Fibroid uterus 01/15/2016  . Abnormal uterine bleeding 06/06/2015  . Affective bipolar disorder ([place]) 03/23/2015  . Essential (primary) hypertension  03/23/2015  . Gastro-esophageal reflux disease without esophagitis 03/23/2015  . Acid reflux 03/23/2015  . Bipolar affective disorder ([place]) 03/23/2015  . Disordered sleep 11/21/2014  . Dyssomnia 11/21/2014  . Disturbance in sleep behavior 11/21/2014  . Restless leg 10/03/2014  . Has a tremor 10/03/2014  . Abnormal involuntary movement 10/03/2014    Past Surgical History:  Procedure Laterality Date  . BARIATRIC SURGERY    . CHOLECYSTECTOMY    . GALLBLADDER SURGERY    . TONSILLECTOMY      Prior to Admission medications   Medication Sig Start Date End Date Taking? Authorizing Provider  ALPRAZolam (XANAX) 0.25 MG tablet Take 1 tablet (0.25 mg total) by mouth at bedtime as needed for anxiety. 12/04/16   Rainey Pines, MD  cyclobenzaprine (FLEXERIL) 10 MG tablet TK 1 T PO Q 8 H PRN FOR MUSCLE CRAMPING 07/31/15   [provider]  DEXILANT 60 MG capsule 60 mg as needed.  03/14/15   [provider]  divalproex (DEPAKOTE ER) 250 MG 24 hr tablet Take 1 tablet (250 mg total) by mouth daily. 12/04/16   Rainey Pines, MD  DULoxetine (CYMBALTA) 30 MG capsule Take 1 capsule (30 mg total) by mouth daily. 12/04/16   Rainey Pines, MD  gabapentin (NEURONTIN) 800 MG tablet Reported on 01/15/2016 06/09/15   [provider]  hydrochlorothiazide (HYDRODIURIL) 12.5 MG tablet daily in am. 09/21/15   [provider]  losartan (COZAAR) 100 MG tablet TK 1 T PO QD bedtime. 03/06/15   [provider]  metoCLOPramide (REGLAN) 10 MG tablet TK 1 T PO WITH MEALS  PRN. 03/14/15   [provider]  Multiple Vitamin (MULTIVITAMIN) tablet Take 1 tablet by mouth every morning.    [provider]  NEUPRO 4 MG/24HR UNW AND APP 1 PA TO  SKIN QD PRF RESTLESS LEGS. 04/27/15   [provider]  pramipexole (MIRAPEX) 0.5 MG tablet every evening.  03/08/15   [provider]    Allergies  Allergen Reactions  . Erythromycin Nausea Only    Family History   Problem Relation Age of Onset  . Dementia Mother   . Hyperlipidemia Mother   . Hypertension Father   . Diabetes Father   . Hyperlipidemia Father     Social History Social History   Tobacco Use  . Smoking status: Current Every Day Smoker    Packs/day: 0.50    Types: Cigarettes    Start date: 03/23/1987  . Smokeless tobacco: Never Used  Substance Use Topics  . Alcohol use: Yes    Alcohol/week: 0.0 standard drinks  . Drug use: No    Review of Systems Constitutional: Negative for fever. Cardiovascular: Negative for chest pain. Respiratory: Negative for shortness of breath. Gastrointestinal: Positive for lower abdominal pain.  Negative nausea vomiting diarrhea. Genitourinary: Negative for urinary compaints Musculoskeletal: Negative for musculoskeletal complaints Skin: Negative for skin complaints  Neurological: Negative for headache All other ROS negative  ____________________________________________   PHYSICAL EXAM:  VITAL SIGNS: ED Triage Vitals  Enc Vitals Group     BP 06/17/18 2052 (!) 152/88     Pulse Rate 06/17/18 2052 96     Resp 06/17/18 2052 16     Temp 06/17/18 2052 98.4 F (36.9 C)     Temp Source 06/17/18 2052 Oral     SpO2 06/17/18 2052 96 %     Weight 06/17/18 2052 206 lb (93.4 kg)     Height 06/17/18 2052 5\' 3"  (1.6 m)     Head Circumference --      Peak Flow --      Pain Score 06/17/18 2102 4     Pain Loc --      Pain Edu? --      Excl. in [place]? --     Constitutional: Alert and oriented. Well appearing and in no distress. Eyes: Normal exam ENT   Head: Normocephalic and atraumatic.   Mouth/Throat: Mucous membranes are moist. Cardiovascular: Normal rate, regular rhythm. No murmur Respiratory: Normal respiratory effort without tachypnea nor retractions. Breath sounds are clear  Gastrointestinal: Soft, mild lower abdominal tenderness palpation more so on the right lower quadrant, moderate in the right lower quadrant.  No rebound guarding  or distention. Musculoskeletal: Nontender with normal range of motion in all extremities.  Neurologic:  Normal speech and language. No gross focal neurologic deficits Skin:  Skin is warm, dry and intact.  Psychiatric: Mood and affect are normal.    INITIAL IMPRESSION / ASSESSMENT AND PLAN / ED COURSE  Pertinent labs & imaging results that were available during my care of the patient were reviewed by me and considered in my medical decision making (see chart for details).  Patient presents to the emergency department for lower abdominal pain.  Patient had a pelvic exam performed by her OB/GYN today and ultimately a CT scan was ordered.  CT scan shows fluid collection and inflammatory changes around the right ovary.  Patient had an ultrasound performed at [facility] and was sent here but does not have the results and we are not able to see the results currently.  Patient did have lab work performed earlier today that appears largely normal besides a white blood cell count of 15,000.  I Tamara discuss the patient with OB/GYN for further recommendations.  It appears the patient already had an ultrasound performed we Tamara attempt to obtain the results of that ultrasound to avoid repeat imaging.  Patient had lab work performed today which was significant for leukocytosis otherwise within normal limits.   I discussed the patient with OB/GYN.  They are most concerned over a tubo-ovarian abscess.  They Tamara be admitting to their service to start IV antibiotics and discuss possibility of surgery.  Patient agreeable to plan of care. ____________________________________________   FINAL CLINICAL IMPRESSION(S) / ED DIAGNOSES  Lower abdominal pain    Harvest Dark, MD 06/17/18 2157

## 2018-06-17 NOTE — ED Triage Notes (Signed)
Patient ambulatory to triage with steady gait, without difficulty or distress noted; CT reports pt had outpt CT ordered by Dr Leafy Ro, [facility] indicates possible abscess vs torsion of ovary; charge nurse aware & bed held for pt

## 2018-06-17 NOTE — ED Notes (Signed)
Paduchowski MD to bedside

## 2018-06-17 NOTE — ED Notes (Signed)
Pt ambulatory to and from bathroom with steady gait

## 2018-06-18 ENCOUNTER — Other Ambulatory Visit: Payer: Self-pay

## 2018-06-18 DIAGNOSIS — N838 Other noninflammatory disorders of ovary, fallopian tube and broad ligament: Secondary | ICD-10-CM | POA: Diagnosis not present

## 2018-06-18 DIAGNOSIS — R102 Pelvic and perineal pain: Secondary | ICD-10-CM | POA: Diagnosis not present

## 2018-06-18 LAB — CBC WITH DIFFERENTIAL/PLATELET
BASOS ABS: 0.1 10*3/uL (ref 0–0.1)
Basophils Relative: 1 %
EOS ABS: 0.5 10*3/uL (ref 0–0.7)
EOS PCT: 5 %
HCT: 34.4 % — ABNORMAL LOW (ref 35.0–47.0)
Hemoglobin: 11.5 g/dL — ABNORMAL LOW (ref 12.0–16.0)
LYMPHS ABS: 2 10*3/uL (ref 1.0–3.6)
Lymphocytes Relative: 20 %
MCH: 29.2 pg (ref 26.0–34.0)
MCHC: 33.6 g/dL (ref 32.0–36.0)
MCV: 86.8 fL (ref 80.0–100.0)
MONO ABS: 0.7 10*3/uL (ref 0.2–0.9)
Monocytes Relative: 8 %
Neutro Abs: 6.6 10*3/uL — ABNORMAL HIGH (ref 1.4–6.5)
Neutrophils Relative %: 66 %
Platelets: 376 10*3/uL (ref 150–440)
RBC: 3.96 MIL/uL (ref 3.80–5.20)
RDW: 15.1 % — AB (ref 11.5–14.5)
WBC: 9.9 10*3/uL (ref 3.6–11.0)

## 2018-06-18 LAB — BASIC METABOLIC PANEL
Anion gap: 6 (ref 5–15)
BUN: 9 mg/dL (ref 6–20)
CO2: 26 mmol/L (ref 22–32)
CREATININE: 0.65 mg/dL (ref 0.44–1.00)
Calcium: 8.1 mg/dL — ABNORMAL LOW (ref 8.9–10.3)
Chloride: 110 mmol/L (ref 98–111)
GFR calc Af Amer: >60 mL/min (ref >60)
GLUCOSE: 129 mg/dL — AB (ref 70–99)
Potassium: 3.5 mmol/L (ref 3.5–5.1)
SODIUM: 142 mmol/L (ref 135–145)

## 2018-06-18 LAB — CHLAMYDIA/NGC RT PCR (ARMC ONLY)
Chlamydia Tr: NOT DETECTED
N GONORRHOEAE: NOT DETECTED

## 2018-06-18 MED ORDER — IBUPROFEN 600 MG PO TABS
600.0000 mg | ORAL_TABLET | Freq: Four times a day (QID) | ORAL | Status: DC | PRN
  Administered 2018-06-18 (×3): 600 mg via ORAL
  Filled 2018-06-18 (×2): qty 1

## 2018-06-18 MED ORDER — SODIUM CHLORIDE 0.9 % IV SOLN
2.0000 g | Freq: Three times a day (TID) | INTRAVENOUS | Status: DC
  Administered 2018-06-18 (×3): 2 g via INTRAVENOUS
  Filled 2018-06-18 (×2): qty 2
  Filled 2018-06-18 (×2): qty 2000
  Filled 2018-06-18: qty 2

## 2018-06-18 MED ORDER — HYDROCODONE-ACETAMINOPHEN 5-325 MG PO TABS: 1.0000 | ORAL_TABLET | ORAL | Status: DC | PRN

## 2018-06-18 MED ORDER — INFLUENZA VAC SPLIT QUAD 0.5 ML IM SUSY
0.5000 mL | PREFILLED_SYRINGE | INTRAMUSCULAR | Status: DC
  Filled 2018-06-18: qty 0.5

## 2018-06-18 MED ORDER — ONDANSETRON HCL 4 MG/2ML IJ SOLN: 4.0000 mg | Freq: Four times a day (QID) | INTRAMUSCULAR | Status: DC | PRN

## 2018-06-18 MED ORDER — DOXYCYCLINE HYCLATE 100 MG PO CAPS: 100.0000 mg | ORAL_CAPSULE | Freq: Two times a day (BID) | ORAL | 0 refills | Status: AC

## 2018-06-18 MED ORDER — ACETAMINOPHEN 650 MG RE SUPP: 650.0000 mg | Freq: Four times a day (QID) | RECTAL | Status: DC | PRN

## 2018-06-18 MED ORDER — ACETAMINOPHEN 325 MG PO TABS: 650.0000 mg | ORAL_TABLET | Freq: Four times a day (QID) | ORAL | Status: DC | PRN

## 2018-06-18 MED ORDER — SODIUM CHLORIDE 0.9 % IV SOLN
INTRAVENOUS | Status: DC | PRN
  Administered 2018-06-18: 250 mL via INTRAVENOUS

## 2018-06-18 MED ORDER — GENTAMICIN SULFATE 40 MG/ML IJ SOLN
5.0000 mg/kg | INTRAVENOUS | Status: DC
  Administered 2018-06-18: 340 mg via INTRAVENOUS
  Filled 2018-06-18 (×2): qty 8.5

## 2018-06-18 MED ORDER — ONDANSETRON HCL 4 MG PO TABS: 4.0000 mg | ORAL_TABLET | Freq: Four times a day (QID) | ORAL | Status: DC | PRN

## 2018-06-18 MED ORDER — CLINDAMYCIN PHOSPHATE 900 MG/50ML IV SOLN
900.0000 mg | Freq: Three times a day (TID) | INTRAVENOUS | Status: DC
  Administered 2018-06-18 (×3): 900 mg via INTRAVENOUS
  Filled 2018-06-18 (×5): qty 50

## 2018-06-18 MED ORDER — METRONIDAZOLE 500 MG PO TABS: 500.0000 mg | ORAL_TABLET | Freq: Two times a day (BID) | ORAL | 0 refills | Status: AC

## 2018-06-18 MED ORDER — DEXTROSE-NACL 5-0.9 % IV SOLN
INTRAVENOUS | Status: DC
  Administered 2018-06-18 (×3): via INTRAVENOUS

## 2018-06-18 NOTE — Plan of Care (Signed)
  Problem: Education: Goal: Knowledge of General Education information will improve Description: Including pain rating scale, medication(s)/side effects and non-pharmacologic comfort measures Outcome: Progressing   Problem: Clinical Measurements: Goal: Will remain free from infection Outcome: Progressing   Problem: Clinical Measurements: Goal: Diagnostic test results will improve Outcome: Progressing   

## 2018-06-18 NOTE — H&P (Signed)
HISTORY  Chief Complaint Abdominal Pain    HPI Tamara Thomas is a 47 y.o. female with a past medical history of anxiety, bipolar, gastric reflux, hypertension, presents to the emergency department for abnormal imaging results.  According to the patient she has been experiencing lower abdominal pain over the past several days.  Saw her OB/GYN: Hassan Buckler, CNM  who performed a pelvic exam for patient with no concerning findings.[place] at the office showed a mass of ovaryRt and CT ordered.   Sent her for a CT scan which showed swelling and fluid around the right ovary.  Patient was sent for an ultrasound but did not know the results of the ultrasound was ultimately sent to the emergency department for evaluation.  Patient states 5/10 pain in the lower abdomen currently.  Dull aching type pain.  Denies any dysuria, hematuria.  Denies any vaginal discharge or bleeding.  No nausea vomiting or upper abdominal pain.  Negative for fever,aching,chills,diarrhea or constipation.       Past Medical History:  Diagnosis Date  . Anxiety   . Bipolar disorder ([place])   . Depression   . GERD (gastroesophageal reflux disease)   . Hypertension   . Restless leg syndrome         Patient Active Problem List   Diagnosis Date Noted  . 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 History of normocytic normochromic anemia 07/29/2016  . Borderline diabetes mellitus 07/29/2016  . Vaccine counseling 07/22/2016  . Tobacco dependence 07/22/2016  . Chronic tension-type headache, not intractable 06/12/2016  . Bilateral foot pain 06/12/2016  . History of EMG 03/07/2016  . Compartment syndrome of right lower extremity due to exertion 02/21/2016  . Compartment syndrome of left lower extremity due to exertion 02/21/2016  . Fibroid uterus 01/15/2016  . Abnormal uterine bleeding 06/06/2015  . Affective bipolar disorder ([place]) 03/23/2015  . Essential (primary) hypertension 03/23/2015  .  Gastro-esophageal reflux disease without esophagitis 03/23/2015  . Acid reflux 03/23/2015  . Bipolar affective disorder ([place]) 03/23/2015  . Disordered sleep 11/21/2014  . Dyssomnia 11/21/2014  . Disturbance in sleep behavior 11/21/2014  . Restless leg 10/03/2014  . Has a tremor 10/03/2014  . Abnormal involuntary movement 10/03/2014         Past Surgical History:  Procedure Laterality Date  . BARIATRIC SURGERY    . CHOLECYSTECTOMY    . GALLBLADDER SURGERY    . TONSILLECTOMY             Prior to Admission medications   Medication Sig Start Date End Date Taking? Authorizing Provider  ALPRAZolam (XANAX) 0.25 MG tablet Take 1 tablet (0.25 mg total) by mouth at bedtime as needed for anxiety. 12/04/16   Rainey Pines, MD  cyclobenzaprine (FLEXERIL) 10 MG tablet TK 1 T PO Q 8 H PRN FOR MUSCLE CRAMPING 07/31/15   [provider]  DEXILANT 60 MG capsule 60 mg as needed.  03/14/15   [provider]  divalproex (DEPAKOTE ER) 250 MG 24 hr tablet Take 1 tablet (250 mg total) by mouth daily. 12/04/16   Rainey Pines, MD  DULoxetine (CYMBALTA) 30 MG capsule Take 1 capsule (30 mg total) by mouth daily. 12/04/16   Rainey Pines, MD  gabapentin (NEURONTIN) 800 MG tablet Reported on 01/15/2016 06/09/15   [provider]  hydrochlorothiazide (HYDRODIURIL) 12.5 MG tablet daily in am. 09/21/15   [provider]  losartan (COZAAR) 100 MG tablet TK 1 T PO QD bedtime. 03/06/15   [provider]  metoCLOPramide (REGLAN) 10 MG tablet TK 1 T PO WITH MEALS PRN. 03/14/15   [provider]  Multiple Vitamin (MULTIVITAMIN) tablet Take 1 tablet by mouth every morning.    [provider]  NEUPRO 4 MG/24HR UNW AND APP 1 PA TO  SKIN QD PRF RESTLESS LEGS. 04/27/15   [provider]  pramipexole (MIRAPEX) 0.5 MG tablet every evening.  03/08/15   [provider]        Allergies  Allergen Reactions  .  Erythromycin Nausea Only         Family History  Problem Relation Age of Onset  . Dementia Mother   . Hyperlipidemia Mother   . Hypertension Father   . Diabetes Father   . Hyperlipidemia Father     Social History Social History        Tobacco Use  . Smoking status: Current Every Day Smoker    Packs/day: 0.50    Types: Cigarettes    Start date: 03/23/1987  . Smokeless tobacco: Never Used  Substance Use Topics  . Alcohol use: Yes    Alcohol/week: 0.0 standard drinks  . Drug use: No    Review of Systems Constitutional: Negative for fever. Cardiovascular: Negative for chest pain, SOB,rapid heartbeat. Respiratory: Negative for shortness of breath, cough, congestion, wheezing. Gastrointestinal: Positive for lower abdominal pain.  Negative nausea vomiting diarrhea. Genitourinary: Negative for urinary compaints Musculoskeletal: Negative for musculoskeletal complaints Skin: Negative for skin complaints  Neurological: Negative for headache All other ROS negative  ____________________________________________   PHYSICAL EXAM:  VITAL SIGNS:     ED Triage Vitals  Enc Vitals Group     BP 06/17/18 2052 (!) 152/88     Pulse Rate 06/17/18 2052 96     Resp 06/17/18 2052 16     Temp 06/17/18 2052 98.4 F (36.9 C)     Temp Source 06/17/18 2052 Oral     SpO2 06/17/18 2052 96 %     Weight 06/17/18 2052 206 lb (93.4 kg)     Height 06/17/18 2052 5\' 3"  (1.6 m)     Head Circumference --      Peak Flow --      Pain Score 06/17/18 2102 4     Pain Loc --      Pain Edu? --      Excl. in [place]? --     Constitutional: Alert and oriented. Well appearing and in no distress. Eyes: Normal exam ENT   Head: Normocephalic and atraumatic.   Mouth/Throat: Mucous membranes are moist. Cardiovascular: Normal rate, regular rhythm. No murmur Respiratory: Normal respiratory effort without tachypnea nor retractions. Breath sounds are clear  Gastrointestinal:  Soft, mild lower abdominal tenderness palpation more so on the right lower quadrant, moderate in the right lower quadrant.  No rebound guarding or distention. Musculoskeletal: Nontender with normal range of motion in all extremities.  Neurologic:  Normal speech and language. No gross focal neurologic deficits Skin:  Skin is warm, dry and intact.  Psychiatric: Mood and affect are normal.    INITIAL IMPRESSION / ASSESSMENT AND PLAN from ED physician  Pertinent labs & imaging results that were available during my care of the patient were reviewed by me and considered in my medical decision making (see chart for details).  Patient presents to the emergency department for lower abdominal pain.  Patient had a pelvic exam performed by her OB/GYN today and ultimately a CT scan was ordered.  CT scan shows fluid collection and inflammatory changes  around the right ovary.  Patient had an ultrasound performed at [facility] and was sent here but does not have the results and we are not able to see the results currently.  Patient did have lab work performed earlier today that appears largely normal besides a white blood cell count of 15,000.  I Tamara discuss the patient with OB/GYN for further recommendations.  It appears the patient already had an ultrasound performed we Tamara attempt to obtain the results of that ultrasound to avoid repeat imaging.  Patient had lab work performed today which was significant for leukocytosis otherwise within normal limits.  A:1. TOA vs. Torsion of Rt ovary 2. Pain:3 on 1-10 scale on exam 3. Partial hysterectomy by Dr Leafy Ro P: Admit for inpt management 2 Amp/Gent/Clindamycin per protocol 3. GC/CH to be done 4.Monitor closely for sepsis, worsening of S/S and need for surgical intervention 5 Treat pain as needed. _____________________________ Danford Bad, MSN, CNM, FNP Certified Nurse Midwife [facility]

## 2018-06-18 NOTE — Progress Notes (Signed)
Pharmacy Antibiotic Note  Tamara Thomas is a 47 y.o. female admitted on 06/17/2018 with tubo-ovarian abscess.  Pharmacy has been consulted for gentamicin dosing/monitoring.  Plan: Tamara plan to dose patient on gentamicin extended interval (once-daily dosing)  Tamara start gentamicin 340 mg IV q24h based on an adjusted body weight of 68.8kg Tamara draw gentamicin trough 09/29 @ 0000 prior to 4th dose. Tamara continue to monitor renal function.  Goal trough < 1 - 2 mcg/mL (undetectable)  Height: 5\' 3"  (160 cm) Weight: 206 lb (93.4 kg) IBW/kg (Calculated) : 52.4  Temp (24hrs), Avg:98.3 F (36.8 C), Min:98.2 F (36.8 C), Max:98.4 F (36.9 C)  Recent Labs  Lab 06/17/18 1724 06/17/18 2113  WBC  --  15.6*  CREATININE 0.80 0.79    Estimated Creatinine Clearance: 93.4 mL/min (by C-G formula based on SCr of 0.79 mg/dL).    Allergies  Allergen Reactions  . Erythromycin Nausea Only   Thank you for allowing pharmacy to be a part of this patient's care.  Tobie Lords, PharmD, BCPS Clinical Pharmacist 06/18/2018

## 2018-06-18 NOTE — Progress Notes (Signed)
06/18/2018 9:08 PM  BP 117/76   Pulse 91   Temp 98.6 F (37 C) (Oral)   Resp 20   Ht 5\' 3"  (160 cm)   Wt 93.4 kg   LMP 12/25/2015 (Approximate) Comment: u preg negative  SpO2 96%   BMI 36.49 kg/m  Patient discharged per MD orders. Discharge instructions reviewed with patient and patient verbalized understanding. IV removed per policy. Prescriptions discussed with patient. Discharged ambulatory escorted by nursing staff.   Almedia Balls, RN

## 2018-06-18 NOTE — Discharge Instructions (Signed)
Ovarian Cyst An ovarian cyst is a fluid-filled sac that forms on an ovary. The ovaries are small organs that produce eggs in women. Various types of cysts can form on the ovaries. Some may cause symptoms and require treatment. Most ovarian cysts go away on their own, are not cancerous (are benign), and do not cause problems. Common types of ovarian cysts include:  Functional (follicle) cysts. ? Occur during the menstrual cycle, and usually go away with the next menstrual cycle if you do not get pregnant. ? Usually cause no symptoms.  Endometriomas. ? Are cysts that form from the tissue that lines the uterus (endometrium). ? Are sometimes called "chocolate cysts" because they become filled with blood that turns brown. ? Can cause pain in the lower abdomen during intercourse and during your period.  Cystadenoma cysts. ? Develop from cells on the outside surface of the ovary. ? Can get very large and cause lower abdomen pain and pain with intercourse. ? Can cause severe pain if they twist or break open (rupture).  Dermoid cysts. ? Are sometimes found in both ovaries. ? May contain different kinds of body tissue, such as skin, teeth, hair, or cartilage. ? Usually do not cause symptoms unless they get very big.  Theca lutein cysts. ? Occur when too much of a certain hormone (human chorionic gonadotropin) is produced and overstimulates the ovaries to produce an egg. ? Are most common after having procedures used to assist with the conception of a baby (in vitro fertilization).  What are the causes? Ovarian cysts may be caused by:  Ovarian hyperstimulation syndrome. This is a condition that can develop from taking fertility medicines. It causes multiple large ovarian cysts to form.  Polycystic ovarian syndrome (PCOS). This is a common hormonal disorder that can cause ovarian cysts, as well as problems with your period or fertility.  What increases the risk? The following factors may make  you more likely to develop ovarian cysts:  Being overweight or obese.  Taking fertility medicines.  Taking certain forms of hormonal birth control.  Smoking.  What are the signs or symptoms? Many ovarian cysts do not cause symptoms. If symptoms are present, they may include:  Pelvic pain or pressure.  Pain in the lower abdomen.  Pain during sex.  Abdominal swelling.  Abnormal menstrual periods.  Increasing pain with menstrual periods.  How is this diagnosed? These cysts are commonly found during a routine pelvic exam. You may have tests to find out more about the cyst, such as:  Ultrasound.  X-ray of the pelvis.  CT scan.  MRI.  Blood tests.  How is this treated? Many ovarian cysts go away on their own without treatment. Your health care provider may want to check your cyst regularly for 2-3 months to see if it changes. If you are in menopause, it is especially important to have your cyst monitored closely because menopausal women have a higher rate of ovarian cancer. When treatment is needed, it may include:  Medicines to help relieve pain.  A procedure to drain the cyst (aspiration).  Surgery to remove the whole cyst.  Hormone treatment or birth control pills. These methods are sometimes used to help dissolve a cyst.  Follow these instructions at home:  Take over-the-counter and prescription medicines only as told by your health care provider.  Do not drive or use heavy machinery while taking prescription pain medicine.  Get regular pelvic exams and Pap tests as often as told by your health care   provider.  Return to your normal activities as told by your health care provider. Ask your health care provider what activities are safe for you.  Do not use any products that contain nicotine or tobacco, such as cigarettes and e-cigarettes. If you need help quitting, ask your health care provider.  Keep all follow-up visits as told by your health care provider.  This is important. Contact a health care provider if:  Your periods are late, irregular, or painful, or they stop.  You have pelvic pain that does not go away.  You have pressure on your bladder or trouble emptying your bladder completely.  You have pain during sex.  You have any of the following in your abdomen: ? A feeling of fullness. ? Pressure. ? Discomfort. ? Pain that does not go away. ? Swelling.  You feel generally ill.  You become constipated.  You lose your appetite.  You develop severe acne.  You start to have more body hair and facial hair.  You are gaining weight or losing weight without changing your exercise and eating habits.  You think you may be pregnant. Get help right away if:  You have abdominal pain that is severe or gets worse.  You cannot eat or drink without vomiting.  You suddenly develop a fever.  Your menstrual period is much heavier than usual. This information is not intended to replace advice given to you by your health care provider. Make sure you discuss any questions you have with your health care provider. Document Released: 09/09/2005 Document Revised: 03/29/2016 Document Reviewed: 02/11/2016 Elsevier Interactive Patient Education  2018 Elsevier Inc.  

## 2018-06-18 NOTE — Progress Notes (Signed)
Obstetric and Gynecology  POD/HD # 1  Subjective  Patient doing well, no complaints, tolerating PO intake, tolerating pain with PO meds, ambulating without difficulty, voiding spontaneously.   Doesn't feel any worse than office visit yesterday, but not improved either.   Denies CP, SOB, F/C, N/V/D, or leg pain.   Objective  Objective:   Vitals:   06/18/18 0106 06/18/18 0357 06/18/18 0744 06/18/18 1205  BP: 131/70 107/66 108/73 122/72  Pulse: 85 77 82 78  Resp: 18 20 18 20   Temp: 98.2 F (36.8 C)  98.7 F (37.1 C) 98.7 F (37.1 C)  TempSrc: Oral  Oral Oral  SpO2: 97% 96% 95% 95%  Weight:      Height:        Intake/Output Summary (Last 24 hours) at 06/18/2018 1330 Last data filed at 06/18/2018 1208 Gross per 24 hour  Intake 1790.71 ml  Output 500 ml  Net 1290.71 ml   General: NAD Cardiovascular: RRR, no murmurs Pulmonary: CTAB, normal respiratory effort Abdomen: + TTP bilateral lower quadrants, R>L,  +BS, slight guarding over lower quadrants Extremities: No erythema or cords, no calf tenderness, with normal peripheral pulses.  Labs: Results for orders placed or performed during the hospital encounter of 06/17/18 (from the past 24 hour(s))  Comprehensive metabolic panel     Status: Abnormal   Collection Time: 06/17/18  9:13 PM  Result Value Ref Range   Sodium 139 135 - 145 mmol/L   Potassium 3.7 3.5 - 5.1 mmol/L   Chloride 106 98 - 111 mmol/L   CO2 24 22 - 32 mmol/L   Glucose, Bld 110 (H) 70 - 99 mg/dL   BUN 9 6 - 20 mg/dL   Creatinine, Ser 0.79 0.44 - 1.00 mg/dL   Calcium 8.8 (L) 8.9 - 10.3 mg/dL   Total Protein 7.7 6.5 - 8.1 g/dL   Albumin 3.9 3.5 - 5.0 g/dL   AST 20 15 - 41 U/L   ALT 23 0 - 44 U/L   Alkaline Phosphatase 104 38 - 126 U/L   Total Bilirubin 0.3 0.3 - 1.2 mg/dL   GFR calc non Af Amer >60 >60 mL/min   GFR calc Af Amer >60 >60 mL/min   Anion gap 9 5 - 15  CBC     Status: Abnormal   Collection Time: 06/17/18  9:13 PM  Result Value Ref Range   WBC 15.6 (H) 3.6 - 11.0 K/uL   RBC 4.08 3.80 - 5.20 MIL/uL   Hemoglobin 12.5 12.0 - 16.0 g/dL   HCT 34.8 (L) 35.0 - 47.0 %   MCV 85.2 80.0 - 100.0 fL   MCH 30.5 26.0 - 34.0 pg   MCHC 35.8 32.0 - 36.0 g/dL   RDW 15.1 (H) 11.5 - 14.5 %   Platelets 427 150 - 440 K/uL  Urinalysis, Complete w Microscopic     Status: Abnormal   Collection Time: 06/17/18  9:13 PM  Result Value Ref Range   Color, Urine STRAW (A) YELLOW   APPearance CLEAR (A) CLEAR   Specific Gravity, Urine 1.027 1.005 - 1.030   pH 6.0 5.0 - 8.0   Glucose, UA NEGATIVE NEGATIVE mg/dL   Hgb urine dipstick NEGATIVE NEGATIVE   Bilirubin Urine NEGATIVE NEGATIVE   Ketones, ur NEGATIVE NEGATIVE mg/dL   Protein, ur NEGATIVE NEGATIVE mg/dL   Nitrite NEGATIVE NEGATIVE   Leukocytes, UA NEGATIVE NEGATIVE   RBC / HPF 0-5 0 - 5 RBC/hpf   WBC, UA 0-5 0 - 5 WBC/hpf  Bacteria, UA RARE (A) NONE SEEN   Squamous Epithelial / LPF 0-5 0 - 5  Lipase, blood     Status: None   Collection Time: 06/17/18  9:13 PM  Result Value Ref Range   Lipase 30 11 - 51 U/L  Chlamydia/NGC rt PCR ([facility] only)     Status: None   Collection Time: 06/18/18  3:15 AM  Result Value Ref Range   Specimen source GC/Chlam URINE, RANDOM    Chlamydia Tr NOT DETECTED NOT DETECTED   N gonorrhoeae NOT DETECTED NOT DETECTED  Basic metabolic panel     Status: Abnormal   Collection Time: 06/18/18  5:01 AM  Result Value Ref Range   Sodium 142 135 - 145 mmol/L   Potassium 3.5 3.5 - 5.1 mmol/L   Chloride 110 98 - 111 mmol/L   CO2 26 22 - 32 mmol/L   Glucose, Bld 129 (H) 70 - 99 mg/dL   BUN 9 6 - 20 mg/dL   Creatinine, Ser 0.65 0.44 - 1.00 mg/dL   Calcium 8.1 (L) 8.9 - 10.3 mg/dL   GFR calc non Af Amer >60 >60 mL/min   GFR calc Af Amer >60 >60 mL/min   Anion gap 6 5 - 15  CBC with Differential/Platelet     Status: Abnormal   Collection Time: 06/18/18  9:36 AM  Result Value Ref Range   WBC 9.9 3.6 - 11.0 K/uL   RBC 3.96 3.80 - 5.20 MIL/uL   Hemoglobin 11.5 (L) 12.0 -  16.0 g/dL   HCT 34.4 (L) 35.0 - 47.0 %   MCV 86.8 80.0 - 100.0 fL   MCH 29.2 26.0 - 34.0 pg   MCHC 33.6 32.0 - 36.0 g/dL   RDW 15.1 (H) 11.5 - 14.5 %   Platelets 376 150 - 440 K/uL   Neutrophils Relative % 66 %   Neutro Abs 6.6 (H) 1.4 - 6.5 K/uL   Lymphocytes Relative 20 %   Lymphs Abs 2.0 1.0 - 3.6 K/uL   Monocytes Relative 8 %   Monocytes Absolute 0.7 0.2 - 0.9 K/uL   Eosinophils Relative 5 %   Eosinophils Absolute 0.5 0 - 0.7 K/uL   Basophils Relative 1 %   Basophils Absolute 0.1 0 - 0.1 K/uL    Cultures: Results for orders placed or performed during the hospital encounter of 06/17/18  Isabella rt PCR ([facility] only)     Status: None   Collection Time: 06/18/18  3:15 AM  Result Value Ref Range Status   Specimen source GC/Chlam URINE, RANDOM  Final   Chlamydia Tr NOT DETECTED NOT DETECTED Final   N gonorrhoeae NOT DETECTED NOT DETECTED Final    Comment: (NOTE) This CT/NG assay has not been evaluated in patients with a history of  hysterectomy. Performed at [facility], [address] [place], [place] [address]     Imaging: Ct Abdomen Pelvis W Contrast  Result Date: 06/17/2018 CLINICAL DATA:  Pelvic pain for 6 days. History of ovarian cyst, gastric sleeve, hysterectomy, cholecystectomy. EXAM: CT ABDOMEN AND PELVIS WITH CONTRAST TECHNIQUE: Multidetector CT imaging of the abdomen and pelvis was performed using the standard protocol following bolus administration of intravenous contrast. CONTRAST:  163mL ISOVUE-300 IOPAMIDOL (ISOVUE-300) INJECTION 61% COMPARISON:  Pelvic MR October 19, 2015 FINDINGS: LOWER CHEST: Lung bases are clear. Included heart size is normal. No pericardial effusion. HEPATOBILIARY: Status post cholecystectomy.  Normal liver. PANCREAS: Normal. SPLEEN: Normal. ADRENALS/URINARY TRACT: Kidneys are orthotopic, demonstrating symmetric enhancement. No nephrolithiasis, hydronephrosis  or solid renal masses. The unopacified ureters are normal in course  and caliber. Delayed imaging through the kidneys demonstrates symmetric prompt contrast excretion within the proximal urinary collecting system. Urinary bladder is partially distended and unremarkable. Normal adrenal glands. STOMACH/BOWEL: Small hiatal hernia with apparent mural edema, status post partial gastrectomy. Mild the small and large bowel are normal in course and caliber without inflammatory changes. Mild colonic diverticulosis. Normal appendix. VASCULAR/LYMPHATIC: Aortoiliac vessels are normal in course and caliber. Trace calcific atherosclerosis. No lymphadenopathy by CT size criteria. REPRODUCTIVE: Status post hysterectomy. Mildly enlarged RIGHT greater LEFT adnexa with multiple anechoic cysts. Superimposed inflammatory changes and RIGHT para ovarian 24 mm fluid collection (sagittal image 44, axial image 69). Linear tubular rim enhancing structure interposed between the ovaries. OTHER: No intraperitoneal free fluid or free air. MUSCULOSKELETAL: Nonacute. Severe L5-S1 disc height loss and endplate spurring with vacuum disc compatible with degenerative disc. Anterior pelvic wall scarring. IMPRESSION: 1. Mildly enlarged ovaries. Superimposed inflammatory changes about RIGHT ovary and 24 mm fluid collection. Constellation of findings concern with tubo-ovarian abscess and/or ovarian torsion. Status post hysterectomy. Recommend pelvic ultrasound. 2. Normal appendix. 3. Small hiatal hernia with inflammatory changes, status post gastric sleeve. 4. These results will be called to the ordering clinician or representative by the Radiology Department at the imaging location. Aortic Atherosclerosis (ICD10-I70.0). Electronically Signed   By: Elon Alas M.D.   On: 06/17/2018 18:35     Assessment   47 y.o. with TOA and pelvic pain   Hospital Day: 2   Plan    D/W Dr Leonides Schanz, repeat CBC this am with improvement of WBCs to 9.9; Pain is managed with ibuprofen; Will consider DC after 24hrs IVPB with close  f/u, has scheduled appt with Dr Leafy Ro on 10/1 at 1100.   Loretha Ure, [place], CNM 06/18/2018 1:38 PM

## 2018-06-18 NOTE — Discharge Summary (Signed)
GynecologicalDischarge Summary  Patient Name: Tamara Thomas DOB: 1971/05/12 MRN: [id number]  Date of Admission: 06/17/2018 Date of Discharge: 06/18/2018  Hospital course:   The patient was admitted for CT findings of right tubo-ovarian mass noted on CT of pelvis and abdomen done on 06/17/18 with possible TOA vs ovarian torsion. Pt was having pelvic pain that was mild and intermittent.   IV antibiotics were started with Ampicillin, Gentamicin and Clindamycin in the Am on 06/18/18. Pain was initially managed with Ibuprofen and pt did not require any narcotics for pain mgmt. Initial WBCs 15.6 with repeat this am down to 9.9. Pt remains afebrile.  Patient was felt to be stable for discharge on hospital day number 1 when she was tolerating a regular diet, pain was controlled with po ibuprofen, and she was ambulating and voiding without difficulty. Vital signs were stable and physical exam remained benign throughout her hospital stay. Reviewed pt labs, presentation with Dr Leonides Schanz and pt was deemed stable for DC home. Rx given for Doxycycline, metronidazole. Pt has Rx for Mobic from office yesterday. Followup appt is scheduled as below. She was given specific instructions and numbers to call in written and verbal format. She verbalized understanding, agrees with the plan of care, and all questions answered to her satisfaction.   Discharge Physical Exam:  BP 117/76   Pulse 91   Temp 98.6 F (37 C) (Oral)   Resp 20   Ht 5\' 3"  (1.6 m)   Wt 93.4 kg   LMP 12/25/2015 (Approximate) Comment: u preg negative  SpO2 96%   BMI 36.49 kg/m   General: NAD CV: RRR Pulm: CTABL, nl effort ABD: soft, + BS, no distention. RLQ tenderness to palpation, none on left.  DVT Evaluation: LE non-ttp, no evidence of DVT on exam.  Hemoglobin  Date Value Ref Range Status  06/18/2018 11.5 (L) 12.0 - 16.0 g/dL Final   HCT  Date Value Ref Range Status  06/18/2018 34.4 (L) 35.0 - 47.0 % Final      Plan:   Tamara Thomas was discharged to home in good condition. Follow-up appointment at [facility] in 5days.    Discharge Medications: Allergies as of 06/18/2018      Reactions   Erythromycin Nausea Only      Medication List    TAKE these medications   ALPRAZolam 0.25 MG tablet Commonly known as:  XANAX Take 1 tablet (0.25 mg total) by mouth at bedtime as needed for anxiety.   amLODipine 10 MG tablet Commonly known as:  NORVASC Take 10 mg by mouth daily.   ARIPiprazole 30 MG tablet Commonly known as:  ABILIFY Take 30 mg by mouth at bedtime.   divalproex 250 MG 24 hr tablet Commonly known as:  DEPAKOTE ER Take 1 tablet (250 mg total) by mouth daily.   doxycycline 100 MG capsule Commonly known as:  VIBRAMYCIN Take 1 capsule (100 mg total) by mouth 2 (two) times daily for 14 days.   DULoxetine 30 MG capsule Commonly known as:  CYMBALTA Take 1 capsule (30 mg total) by mouth daily. What changed:  how much to take   lamoTRIgine 100 MG tablet Commonly known as:  LAMICTAL Take 100 mg by mouth daily.   metroNIDAZOLE 500 MG tablet Commonly known as:  FLAGYL Take 1 tablet (500 mg total) by mouth 2 (two) times daily for 14 days.   pramipexole 0.5 MG tablet Commonly known as:  MIRAPEX Take 0.5 mg by mouth every evening.  Follow-up Information    Benjaman Kindler, MD. Go on 06/23/2018.   Specialty:  Obstetrics and Gynecology Contact information: [place] [address] [phone]           Signed: McVey, REBECCA A, CNM 8:27 PM

## 2018-06-19 LAB — MEASLES/MUMPS/RUBELLA IMMUNITY
MUMPS IGG: 28.5 [AU]/ml (ref >10.9)
RUBELLA: 8.98 {index} (ref >0.99)
RUBEOLA IGG: 210 [AU]/ml (ref >16.4)

## 2018-06-19 LAB — HIV ANTIBODY (ROUTINE TESTING W REFLEX): HIV SCREEN 4TH GENERATION: NONREACTIVE

## 2018-06-23 DIAGNOSIS — N9489 Other specified conditions associated with female genital organs and menstrual cycle: Secondary | ICD-10-CM | POA: Diagnosis not present

## 2018-06-30 DIAGNOSIS — R102 Pelvic and perineal pain: Secondary | ICD-10-CM | POA: Diagnosis not present

## 2018-06-30 DIAGNOSIS — N838 Other noninflammatory disorders of ovary, fallopian tube and broad ligament: Secondary | ICD-10-CM | POA: Diagnosis not present

## 2018-06-30 NOTE — H&P (Signed)
Tamara Thomas is a 47 y.o. female here for Pre Op Consulting .  Pt with a hx of TAH/BS for large fibroids, lap band surgery, gastric sleeve surgery, who presents with acute pelvic pain for 10 days that came on suddenly and was immediately severe. She first presented to the office 5 days later with continued pain. An ultrasound found bilateral blood flow to the ovaries but bilateral complex and large (largest 6cm) ovarian cysts with septations and internal vascularity.  A CT scan that day suggested adnexal inflammatory process with a rim-enhancing lesion on the right measuring 2 cm. Her WBC was 15, and she was admitted for overnight abx for possible adnexal abscess, after which she felt better and was discharged.  We planned for dx lap with pelvic washings, possible bilateral oophorectomy possible lymph node dissection with Dr. Theora Gianotti of gyn oncology.  However, today she is feeling much improved. No pain meds x4 days, no acute abdomen. She is finishing her abx for ?TOA in 3 days.   s/p TAH 01/15/16- 450g, large fibroids and endometriosis, LOA  Past Medical History:  has a past medical history of Bipolar disorder (CMS-HCC), Depression (1999), Essential hypertension with goal blood pressure less than 140/90, GERD without esophagitis, Neuropathy (2010), Restless legs syndrome, and Tremor.  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 Family History: family history includes Alzheimer's disease in her maternal grandmother; Dementia in her mother; High blood pressure (Hypertension) in her father; No Known Problems in her sister; Tremor in her father. Social History:  reports that she has been smoking cigarettes.  She has a 14.00 pack-year smoking history. She has never used smokeless tobacco. She  reports that she drinks alcohol. She reports that she does not use drugs. OB/GYN History:          OB History    Gravida  2   Para      Term      Preterm      AB  2   Living        SAB  2   TAB      Ectopic      Molar      Multiple      Live Births             Allergies: is allergic to erythromycin. Medications:  Current Outpatient Medications:  .  amLODIPine (NORVASC) 10 MG tablet, TAKE 1 TABLET BY MOUTH EVERY DAY, Disp: 90 tablet, Rfl: 0 .  ARIPiprazole (ABILIFY) 30 MG tablet, TAKE 1 TABLET(30 MG) BY MOUTH EVERY DAY, Disp: 90 tablet, Rfl: 1 .  cetirizine (ZYRTEC) 10 MG tablet, Take 10 mg by mouth once daily., Disp: , Rfl:  .  cyclobenzaprine (FLEXERIL) 10 MG tablet, Take one tablet as needed for muscle spasms in arms., Disp: 30 tablet, Rfl: 0 .  doxycycline (VIBRAMYCIN) 100 MG capsule, TK ONE C PO BID FOR 14 DAYS, Disp: , Rfl: 0 .  DULoxetine (CYMBALTA) 30 MG DR capsule, Take 3 capsules (90 mg total) by mouth once daily, Disp: 270 capsule, Rfl: 0 .  fluticasone (FLONASE) 50 mcg/actuation nasal spray, Place 2 sprays into both nostrils once daily as needed for Rhinitis., Disp: , Rfl:  .  lamoTRIgine (LAMICTAL) 100 MG tablet, TAKE 1 TABLET(100 MG) BY MOUTH EVERY DAY, Disp: 90 tablet, Rfl: 1 .  lamoTRIgine (LAMICTAL) 100 MG tablet,  TAKE 1 TABLET(100 MG) BY MOUTH EVERY DAY, Disp: 30 tablet, Rfl: 0 .  meloxicam (MOBIC) 15 MG tablet, Take 1 tablet (15 mg total) by mouth once daily, Disp: 30 tablet, Rfl: 0 .  metroNIDAZOLE (FLAGYL) 500 MG tablet, TK 1 T PO BID FOR 14 DAYS, Disp: , Rfl: 0 .  oxyCODONE-acetaminophen (PERCOCET) 5-325 mg tablet, Take 1 tablet by mouth 2 (two) times daily As needed for pain, Disp: 25 tablet, Rfl: 0 .  pramipexole (MIRAPEX) 0.5 MG tablet, TAKE ONE-HALF TO 1 TABLET BY MOUTH NIGHTLY, Disp: 60 tablet, Rfl: 3  Review of Systems: No SOB, no palpitations or chest pain, no new lower extremity edema, no nausea or vomiting or bowel or bladder  complaints. See HPI for gyn specific ROS.    Exam:      Vitals:   06/30/18 1435  BP: 118/87  Pulse: 89   Body mass index is 36.42 kg/m.  General: Well-developed, well-nourished  female in no acute distress Body mass index is 36.42 kg/m. Skin: No rashes, ulcers or skin lesions noted. No excessive hirsutism or acne noted.  Neurological: Appears alert and oriented and is a good historian. No gross abnormalities are noted. Psychological: Normal affect and mood. No signs of anxiety or depression noted.  Abdomen: Soft, nontender, without hepatosplenomegaly or masses. No hernias. Gently TTP in RLQ Pelvic exam: Deferred    Impression:   The primary encounter diagnosis was Pelvic pain in female. A diagnosis of Bilateral tubo-ovarian mass was also pertinent to this visit.    Plan:   - Patient returns for a preoperative discussion regarding her plans to proceed with surgical treatment of her pelvic pain and complex adnexal masses by dx lap with possible oophorectomy and lymph node dissection procedure.We were planning to do this laparoscopically, using the robot.  However, she is feeling significantly better. I plan to repeat her CA125 and TVUS prior to planned surgery in 8 days. If improved, I will change procedure to dx lap and pelvic washings only.    The patient and I discussed the technical aspects of the procedure including the potential for risks and complications. These include but are not limited to the risk of infection requiring post-operative antibiotics or further procedures. We talked about the risk of injury to adjacent organs including bladder, bowel, ureter, blood vessels or nerves. We talked about the need to convert to an open incision. We talked about the possible need for blood transfusion. We talked aboutpostop complications such asthromboembolic or cardiopulmonary complications. All of her questions were answered.  Her preoperative exam was  completed and the appropriate consents were signed. She is scheduled to undergo this procedure in the near future.

## 2018-07-01 ENCOUNTER — Other Ambulatory Visit: Payer: Self-pay

## 2018-07-01 ENCOUNTER — Encounter
Admission: RE | Admit: 2018-07-01 | Discharge: 2018-07-01 | Disposition: A | Discharge status: Home / Self Care | Payer: 59 | Source: Ambulatory Visit | Attending: Obstetrics and Gynecology | Admitting: Obstetrics and Gynecology

## 2018-07-01 DIAGNOSIS — I10 Essential (primary) hypertension: Secondary | ICD-10-CM | POA: Diagnosis not present

## 2018-07-01 DIAGNOSIS — Z01818 Encounter for other preprocedural examination: Secondary | ICD-10-CM | POA: Diagnosis not present

## 2018-07-01 HISTORY — DX: Unspecified abdominal pain: R10.9

## 2018-07-01 LAB — TYPE AND SCREEN
ABO/RH(D): A NEG
ANTIBODY SCREEN: NEGATIVE

## 2018-07-01 LAB — CBC
HCT: 44.4 % (ref 36.0–46.0)
Hemoglobin: 14.1 g/dL (ref 12.0–15.0)
MCH: 27.9 pg (ref 26.0–34.0)
MCHC: 31.8 g/dL (ref 30.0–36.0)
MCV: 87.9 fL (ref 80.0–100.0)
NRBC: 0 % (ref 0.0–0.2)
PLATELETS: 580 10*3/uL — AB (ref 150–400)
RBC: 5.05 MIL/uL (ref 3.87–5.11)
RDW: 15.3 % (ref 11.5–15.5)
WBC: 11.5 10*3/uL — ABNORMAL HIGH (ref 4.0–10.5)

## 2018-07-01 LAB — BASIC METABOLIC PANEL
ANION GAP: 9 (ref 5–15)
BUN: 9 mg/dL (ref 6–20)
CHLORIDE: 103 mmol/L (ref 98–111)
CO2: 26 mmol/L (ref 22–32)
Calcium: 9 mg/dL (ref 8.9–10.3)
Creatinine, Ser: 0.71 mg/dL (ref 0.44–1.00)
GFR calc Af Amer: >60 mL/min (ref >60)
GLUCOSE: 140 mg/dL — AB (ref 70–99)
POTASSIUM: 4 mmol/L (ref 3.5–5.1)
Sodium: 138 mmol/L (ref 135–145)

## 2018-07-01 NOTE — Pre-Procedure Instructions (Signed)
Instructed in the use of incentive spirometry.

## 2018-07-01 NOTE — Patient Instructions (Signed)
Your procedure is scheduled on: 07/08/18 Wed Report to Same Day Surgery 2nd floor medical mall [facility] Entrance-take elevator on left to 2nd floor.  Check in with surgery information desk.) To find out your arrival time please call [phone] between 1PM - 3PM on 07/07/18 Tues  Remember: Instructions that are not followed completely may result in serious medical risk, up to and including death, or upon the discretion of your surgeon and anesthesiologist your surgery may need to be rescheduled.    _x___ 1. Do not eat food after midnight the night before your procedure. You may drink clear liquids up to 2 hours before you are scheduled to arrive at the hospital for your procedure.  Do not drink clear liquids within 2 hours of your scheduled arrival to the hospital.  Clear liquids include  --Water or Apple juice without pulp  --Clear carbohydrate beverage such as ClearFast or Gatorade  --Black Coffee or Clear Tea (No milk, no creamers, do not add anything to                  the coffee or Tea Type 1 and type 2 diabetics should only drink water.   ____Ensure clear carbohydrate drink on the way to the hospital for bariatric patients  ____Ensure clear carbohydrate drink 3 hours before surgery for Dr Dwyane Luo patients if physician instructed.   No gum chewing or hard candies.     __x__ 2. No Alcohol for 24 hours before or after surgery.   __x__3. No Smoking or e-cigarettes for 24 prior to surgery.  Do not use any chewable tobacco products for at least 6 hour prior to surgery   ____  4. Bring all medications with you on the day of surgery if instructed.    __x__ 5. Notify your doctor if there is any change in your medical condition     (cold, fever, infections).    x___6. On the morning of surgery brush your teeth with toothpaste and water.  You may rinse your mouth with mouth wash if you wish.  Do not swallow any toothpaste or mouthwash.   Do not wear jewelry, make-up, hairpins,  clips or nail polish.  Do not wear lotions, powders, or perfumes. You may wear deodorant.  Do not shave 48 hours prior to surgery. Men may shave face and neck.  Do not bring valuables to the hospital.    [facility] is not responsible for any belongings or valuables.               Contacts, dentures or bridgework may not be worn into surgery.  Leave your suitcase in the car. After surgery it may be brought to your room.  For patients admitted to the hospital, discharge time is determined by your                       treatment team.  _  Patients discharged the day of surgery will not be allowed to drive home.  You will need someone to drive you home and stay with you the night of your procedure.    Please read over the following fact sheets that you were given:   [facility] Preparing for Surgery and or MRSA Information   _x___ Take anti-hypertensive listed below, cardiac, seizure, asthma,     anti-reflux and psychiatric medicines. These include:  1. DULoxetine (CYMBALTA) 90 MG capsule  2.lamoTRIgine (LAMICTAL) 100 MG tablet  3.  4.  5.  6.  ____Fleets enema or Magnesium Citrate as directed.   _x___ Use CHG Soap or sage wipes as directed on instruction sheet   ____ Use inhalers on the day of surgery and bring to hospital day of surgery  ____ Stop Metformin and Janumet 2 days prior to surgery.    ____ Take 1/2 of usual insulin dose the night before surgery and none on the morning     surgery.   _x___ Follow recommendations from Cardiologist, Pulmonologist or PCP regarding          stopping Aspirin, Coumadin, Plavix ,Eliquis, Effient, or Pradaxa, and Pletal.  X____Stop Anti-inflammatories such as Advil, Aleve, Ibuprofen, Motrin, Naproxen, Naprosyn, Goodies powders or aspirin products. OK to take Tylenol and                          Celebrex.   _x___ Stop supplements until after surgery.  But may continue Vitamin D, Vitamin B,       and multivitamin.   ____ Bring C-Pap to the  hospital.

## 2018-07-06 DIAGNOSIS — R102 Pelvic and perineal pain: Secondary | ICD-10-CM | POA: Diagnosis not present

## 2018-07-10 ENCOUNTER — Ambulatory Visit: Payer: 59 | Admitting: Certified Registered Nurse Anesthetist

## 2018-07-10 ENCOUNTER — Ambulatory Visit
Admission: RE | Admit: 2018-07-10 | Discharge: 2018-07-10 | Disposition: A | Discharge status: Home / Self Care | Payer: 59 | Source: Ambulatory Visit | Attending: Obstetrics and Gynecology | Admitting: Obstetrics and Gynecology

## 2018-07-10 ENCOUNTER — Encounter
Admission: RE | Disposition: A | Discharge status: Home / Self Care | Payer: Self-pay | Source: Ambulatory Visit | Attending: Obstetrics and Gynecology

## 2018-07-10 DIAGNOSIS — K660 Peritoneal adhesions (postprocedural) (postinfection): Secondary | ICD-10-CM | POA: Diagnosis not present

## 2018-07-10 DIAGNOSIS — N83291 Other ovarian cyst, right side: Secondary | ICD-10-CM | POA: Diagnosis not present

## 2018-07-10 DIAGNOSIS — Z791 Long term (current) use of non-steroidal anti-inflammatories (NSAID): Secondary | ICD-10-CM | POA: Diagnosis not present

## 2018-07-10 DIAGNOSIS — N803 Endometriosis of pelvic peritoneum: Secondary | ICD-10-CM | POA: Diagnosis not present

## 2018-07-10 DIAGNOSIS — G8929 Other chronic pain: Secondary | ICD-10-CM | POA: Diagnosis not present

## 2018-07-10 DIAGNOSIS — F17210 Nicotine dependence, cigarettes, uncomplicated: Secondary | ICD-10-CM | POA: Insufficient documentation

## 2018-07-10 DIAGNOSIS — N801 Endometriosis of ovary: Secondary | ICD-10-CM | POA: Insufficient documentation

## 2018-07-10 DIAGNOSIS — I10 Essential (primary) hypertension: Secondary | ICD-10-CM | POA: Diagnosis not present

## 2018-07-10 DIAGNOSIS — N80129 Deep endometriosis of ovary, unspecified ovary: Secondary | ICD-10-CM

## 2018-07-10 DIAGNOSIS — F319 Bipolar disorder, unspecified: Secondary | ICD-10-CM | POA: Insufficient documentation

## 2018-07-10 DIAGNOSIS — Z79899 Other long term (current) drug therapy: Secondary | ICD-10-CM | POA: Insufficient documentation

## 2018-07-10 DIAGNOSIS — K219 Gastro-esophageal reflux disease without esophagitis: Secondary | ICD-10-CM | POA: Insufficient documentation

## 2018-07-10 DIAGNOSIS — Z8249 Family history of ischemic heart disease and other diseases of the circulatory system: Secondary | ICD-10-CM | POA: Diagnosis not present

## 2018-07-10 DIAGNOSIS — N83201 Unspecified ovarian cyst, right side: Secondary | ICD-10-CM | POA: Diagnosis not present

## 2018-07-10 DIAGNOSIS — Z7951 Long term (current) use of inhaled steroids: Secondary | ICD-10-CM | POA: Diagnosis not present

## 2018-07-10 DIAGNOSIS — Z881 Allergy status to other antibiotic agents status: Secondary | ICD-10-CM | POA: Diagnosis not present

## 2018-07-10 DIAGNOSIS — R102 Pelvic and perineal pain: Secondary | ICD-10-CM | POA: Insufficient documentation

## 2018-07-10 DIAGNOSIS — N83202 Unspecified ovarian cyst, left side: Secondary | ICD-10-CM | POA: Diagnosis present

## 2018-07-10 DIAGNOSIS — N736 Female pelvic peritoneal adhesions (postinfective): Secondary | ICD-10-CM | POA: Diagnosis not present

## 2018-07-10 HISTORY — PX: PELVIC LYMPH NODE DISSECTION: SHX6543

## 2018-07-10 HISTORY — PX: LAPAROSCOPY: SHX197

## 2018-07-10 LAB — ABO/RH: ABO/RH(D): A NEG

## 2018-07-10 SURGERY — LAPAROSCOPY, DIAGNOSTIC
Anesthesia: General | Laterality: Right

## 2018-07-10 MED ORDER — MIDAZOLAM HCL 2 MG/2ML IJ SOLN
INTRAMUSCULAR | Status: AC
  Filled 2018-07-10: qty 2

## 2018-07-10 MED ORDER — FAMOTIDINE 20 MG PO TABS
20.0000 mg | ORAL_TABLET | Freq: Once | ORAL | Status: AC
  Administered 2018-07-10: 20 mg via ORAL

## 2018-07-10 MED ORDER — DOCUSATE SODIUM 100 MG PO CAPS: 100.0000 mg | ORAL_CAPSULE | Freq: Two times a day (BID) | ORAL | 0 refills | Status: DC

## 2018-07-10 MED ORDER — DEXAMETHASONE SODIUM PHOSPHATE 10 MG/ML IJ SOLN
INTRAMUSCULAR | Status: DC | PRN
  Administered 2018-07-10: 10 mg via INTRAVENOUS

## 2018-07-10 MED ORDER — HYDROMORPHONE HCL 1 MG/ML IJ SOLN
INTRAMUSCULAR | Status: AC
  Filled 2018-07-10: qty 1

## 2018-07-10 MED ORDER — LACTATED RINGERS IV SOLN
INTRAVENOUS | Status: DC
  Administered 2018-07-10 (×2): via INTRAVENOUS

## 2018-07-10 MED ORDER — ROCURONIUM BROMIDE 100 MG/10ML IV SOLN
INTRAVENOUS | Status: DC | PRN
  Administered 2018-07-10: 10 mg via INTRAVENOUS
  Administered 2018-07-10 (×2): 5 mg via INTRAVENOUS
  Administered 2018-07-10: 10 mg via INTRAVENOUS
  Administered 2018-07-10: 45 mg via INTRAVENOUS
  Administered 2018-07-10: 10 mg via INTRAVENOUS

## 2018-07-10 MED ORDER — IBUPROFEN 800 MG PO TABS: 800.0000 mg | ORAL_TABLET | Freq: Three times a day (TID) | ORAL | 1 refills | Status: DC | PRN

## 2018-07-10 MED ORDER — SUGAMMADEX SODIUM 200 MG/2ML IV SOLN
INTRAVENOUS | Status: DC | PRN
  Administered 2018-07-10: 186.8 mg via INTRAVENOUS

## 2018-07-10 MED ORDER — PHENYLEPHRINE HCL 10 MG/ML IJ SOLN
INTRAMUSCULAR | Status: AC
  Filled 2018-07-10: qty 1

## 2018-07-10 MED ORDER — SUGAMMADEX SODIUM 200 MG/2ML IV SOLN
INTRAVENOUS | Status: AC
  Filled 2018-07-10: qty 2

## 2018-07-10 MED ORDER — BUPIVACAINE HCL (PF) 0.5 % IJ SOLN
INTRAMUSCULAR | Status: AC
  Filled 2018-07-10: qty 30

## 2018-07-10 MED ORDER — ROCURONIUM BROMIDE 50 MG/5ML IV SOLN
INTRAVENOUS | Status: AC
  Filled 2018-07-10: qty 1

## 2018-07-10 MED ORDER — OXYCODONE HCL 5 MG PO CAPS: 5.0000 mg | ORAL_CAPSULE | Freq: Four times a day (QID) | ORAL | 0 refills | Status: DC | PRN

## 2018-07-10 MED ORDER — DEXAMETHASONE SODIUM PHOSPHATE 10 MG/ML IJ SOLN
INTRAMUSCULAR | Status: AC
  Filled 2018-07-10: qty 1

## 2018-07-10 MED ORDER — KETOROLAC TROMETHAMINE 30 MG/ML IJ SOLN
INTRAMUSCULAR | Status: AC
  Filled 2018-07-10: qty 1

## 2018-07-10 MED ORDER — LIDOCAINE HCL (CARDIAC) PF 100 MG/5ML IV SOSY
PREFILLED_SYRINGE | INTRAVENOUS | Status: DC | PRN
  Administered 2018-07-10: 100 mg via INTRAVENOUS

## 2018-07-10 MED ORDER — FENTANYL CITRATE (PF) 100 MCG/2ML IJ SOLN: 25.0000 ug | INTRAMUSCULAR | Status: DC | PRN

## 2018-07-10 MED ORDER — FENTANYL CITRATE (PF) 100 MCG/2ML IJ SOLN
INTRAMUSCULAR | Status: AC
  Filled 2018-07-10: qty 2

## 2018-07-10 MED ORDER — ACETAMINOPHEN 500 MG PO TABS: 1000.0000 mg | ORAL_TABLET | Freq: Four times a day (QID) | ORAL | 0 refills | Status: AC

## 2018-07-10 MED ORDER — PROPOFOL 10 MG/ML IV BOLUS
INTRAVENOUS | Status: AC
  Filled 2018-07-10: qty 40

## 2018-07-10 MED ORDER — MIDAZOLAM HCL 2 MG/2ML IJ SOLN
INTRAMUSCULAR | Status: DC | PRN
  Administered 2018-07-10: 2 mg via INTRAVENOUS

## 2018-07-10 MED ORDER — SUCCINYLCHOLINE CHLORIDE 20 MG/ML IJ SOLN
INTRAMUSCULAR | Status: AC
  Filled 2018-07-10: qty 1

## 2018-07-10 MED ORDER — ONDANSETRON HCL 4 MG/2ML IJ SOLN
INTRAMUSCULAR | Status: DC | PRN
  Administered 2018-07-10: 4 mg via INTRAVENOUS

## 2018-07-10 MED ORDER — ONDANSETRON HCL 4 MG/2ML IJ SOLN
INTRAMUSCULAR | Status: AC
  Filled 2018-07-10: qty 2

## 2018-07-10 MED ORDER — ACETAMINOPHEN 10 MG/ML IV SOLN
INTRAVENOUS | Status: AC
  Filled 2018-07-10: qty 100

## 2018-07-10 MED ORDER — GABAPENTIN 800 MG PO TABS: 800.0000 mg | ORAL_TABLET | Freq: Every day | ORAL | 0 refills | Status: DC

## 2018-07-10 MED ORDER — GLYCOPYRROLATE 0.2 MG/ML IJ SOLN
INTRAMUSCULAR | Status: AC
  Filled 2018-07-10: qty 1

## 2018-07-10 MED ORDER — PROPOFOL 10 MG/ML IV BOLUS
INTRAVENOUS | Status: DC | PRN
  Administered 2018-07-10: 200 mg via INTRAVENOUS

## 2018-07-10 MED ORDER — LACTATED RINGERS IV SOLN: INTRAVENOUS | Status: DC

## 2018-07-10 MED ORDER — FAMOTIDINE 20 MG PO TABS
ORAL_TABLET | ORAL | Status: AC
  Filled 2018-07-10: qty 1

## 2018-07-10 MED ORDER — LIDOCAINE HCL (PF) 2 % IJ SOLN
INTRAMUSCULAR | Status: AC
  Filled 2018-07-10: qty 10

## 2018-07-10 MED ORDER — GLYCOPYRROLATE 0.2 MG/ML IJ SOLN
INTRAMUSCULAR | Status: DC | PRN
  Administered 2018-07-10: 0.2 mg via INTRAVENOUS

## 2018-07-10 MED ORDER — FENTANYL CITRATE (PF) 100 MCG/2ML IJ SOLN
INTRAMUSCULAR | Status: DC | PRN
  Administered 2018-07-10 (×4): 50 ug via INTRAVENOUS

## 2018-07-10 MED ORDER — SUCCINYLCHOLINE CHLORIDE 20 MG/ML IJ SOLN
INTRAMUSCULAR | Status: DC | PRN
  Administered 2018-07-10: 100 mg via INTRAVENOUS

## 2018-07-10 MED ORDER — HYDROMORPHONE HCL 1 MG/ML IJ SOLN
INTRAMUSCULAR | Status: DC | PRN
  Administered 2018-07-10 (×2): 0.5 mg via INTRAVENOUS

## 2018-07-10 MED ORDER — ONDANSETRON HCL 4 MG/2ML IJ SOLN: 4.0000 mg | Freq: Once | INTRAMUSCULAR | Status: DC | PRN

## 2018-07-10 MED ORDER — ACETAMINOPHEN 10 MG/ML IV SOLN
INTRAVENOUS | Status: DC | PRN
  Administered 2018-07-10: 1000 mg via INTRAVENOUS

## 2018-07-10 MED ORDER — PHENYLEPHRINE HCL 10 MG/ML IJ SOLN
INTRAMUSCULAR | Status: DC | PRN
  Administered 2018-07-10: 200 ug via INTRAVENOUS
  Administered 2018-07-10: 100 ug via INTRAVENOUS

## 2018-07-10 MED ORDER — BUPIVACAINE HCL 0.5 % IJ SOLN
INTRAMUSCULAR | Status: DC | PRN
  Administered 2018-07-10: 15 mL

## 2018-07-10 SURGICAL SUPPLY — 41 items
APPLICATOR ARISTA FLEXITIP XL (MISCELLANEOUS) ×3 IMPLANT
BAG URINE DRAINAGE (UROLOGICAL SUPPLIES) ×3 IMPLANT
BLADE SURG SZ11 CARB STEEL (BLADE) ×3 IMPLANT
CATH FOLEY 2WAY  5CC 16FR (CATHETERS) ×1
CATH URTH 16FR FL 2W BLN LF (CATHETERS) ×2 IMPLANT
CHLORAPREP W/TINT 26ML (MISCELLANEOUS) ×3 IMPLANT
COVER WAND RF STERILE (DRAPES) ×3 IMPLANT
DERMABOND ADVANCED (GAUZE/BANDAGES/DRESSINGS) ×1
DERMABOND ADVANCED .7 DNX12 (GAUZE/BANDAGES/DRESSINGS) ×2 IMPLANT
DRESSING SURGICEL FIBRLLR 1X2 (HEMOSTASIS) ×2 IMPLANT
DRSG SURGICEL FIBRILLAR 1X2 (HEMOSTASIS) ×3
GLOVE BIO SURGEON STRL SZ7 (GLOVE) ×6 IMPLANT
GLOVE BIOGEL PI IND STRL 7.5 (GLOVE) ×2 IMPLANT
GLOVE BIOGEL PI INDICATOR 7.5 (GLOVE) ×1
GLOVE INDICATOR 7.5 STRL GRN (GLOVE) ×9 IMPLANT
GOWN STRL REUS W/ TWL LRG LVL3 (GOWN DISPOSABLE) ×8 IMPLANT
GOWN STRL REUS W/TWL LRG LVL3 (GOWN DISPOSABLE) ×4
HEMOSTAT ARISTA ABSORB 3G PWDR (MISCELLANEOUS) ×3 IMPLANT
IRRIGATION STRYKERFLOW (MISCELLANEOUS) ×2 IMPLANT
IRRIGATOR STRYKERFLOW (MISCELLANEOUS) ×3
IV NS 1000ML (IV SOLUTION) ×1
IV NS 1000ML BAXH (IV SOLUTION) ×2 IMPLANT
KIT PINK PAD W/HEAD ARE REST (MISCELLANEOUS) ×3
KIT PINK PAD W/HEAD ARM REST (MISCELLANEOUS) ×2 IMPLANT
KIT TURNOVER CYSTO (KITS) ×3 IMPLANT
LABEL OR SOLS (LABEL) ×3 IMPLANT
LIGASURE VESSEL 5MM BLUNT TIP (ELECTROSURGICAL) ×3 IMPLANT
NS IRRIG 500ML POUR BTL (IV SOLUTION) ×3 IMPLANT
PACK GYN LAPAROSCOPIC (MISCELLANEOUS) ×3 IMPLANT
PAD OB MATERNITY 4.3X12.25 (PERSONAL CARE ITEMS) ×3 IMPLANT
PAD PREP 24X41 OB/GYN DISP (PERSONAL CARE ITEMS) ×3 IMPLANT
POUCH SPECIMEN RETRIEVAL 10MM (ENDOMECHANICALS) IMPLANT
SCISSORS METZENBAUM CVD 33 (INSTRUMENTS) ×3 IMPLANT
SLEEVE ENDOPATH XCEL 5M (ENDOMECHANICALS) ×3 IMPLANT
STRIP CLOSURE SKIN 1/4X4 (GAUZE/BANDAGES/DRESSINGS) ×3 IMPLANT
SUT MNCRL AB 4-0 PS2 18 (SUTURE) ×3 IMPLANT
SUT VIC AB 2-0 UR6 27 (SUTURE) ×3 IMPLANT
SUT VIC AB 4-0 SH 27 (SUTURE) ×1
SUT VIC AB 4-0 SH 27XANBCTRL (SUTURE) ×2 IMPLANT
TROCAR XCEL NON-BLD 5MMX100MML (ENDOMECHANICALS) ×3 IMPLANT
TUBING INSUFFLATION (TUBING) ×3 IMPLANT

## 2018-07-10 NOTE — OR Nursing (Signed)
Discharge instructions discussed with pt and husband. Both voice understanding. 

## 2018-07-10 NOTE — Anesthesia Procedure Notes (Signed)
Procedure Name: Intubation Performed by: Demetrius Charity, CRNA Pre-anesthesia Checklist: Patient identified, Patient being monitored, Timeout performed, Emergency Drugs available and Suction available Patient Re-evaluated:Patient Re-evaluated prior to induction Oxygen Delivery Method: Circle system utilized Preoxygenation: Pre-oxygenation with 100% oxygen Induction Type: IV induction Ventilation: Mask ventilation without difficulty Laryngoscope Size: 3 and McGraph Grade View: Grade II Tube type: Oral Tube size: 7.0 mm Number of attempts: 1 Airway Equipment and Method: Stylet Placement Confirmation: ETT inserted through vocal cords under direct vision,  positive ETCO2 and breath sounds checked- equal and bilateral Secured at: 22 cm Tube secured with: Tape Dental Injury: Teeth and Oropharynx as per pre-operative assessment  Difficulty Due To: Difficult Airway- due to limited oral opening, Difficult Airway- due to reduced neck mobility and Difficulty was anticipated Future Recommendations: Recommend- induction with short-acting agent, and alternative techniques readily available

## 2018-07-10 NOTE — Anesthesia Post-op Follow-up Note (Signed)
Anesthesia QCDR form completed.        

## 2018-07-10 NOTE — Addendum Note (Signed)
Addendum  created 07/10/18 1104 by Demetrius Charity, CRNA   Intraprocedure Event edited, Intraprocedure Staff edited

## 2018-07-10 NOTE — Discharge Instructions (Signed)
Laparoscopic Ovarian Surgery Discharge Instructions  For the next three days, take ibuprofen and acetaminophen on a schedule, every 8 hours. You can take them together or you can intersperse them, and take one every four hours. I also gave you gabapentin for nighttime, to help you sleep and also to control pain. Take gabapentin medicines at night for at least the next 3 nights. You also have a narcotic, oxycodone, to take as needed if the above medicines don't help.  Postop constipation is a major cause of pain. Stay well hydrated, walk as you tolerate, and take over the counter senna as well as stool softeners if you need them.   RISKS AND COMPLICATIONS  Infection. Bleeding. Injury to surrounding organs. Anesthetic side effects.   PROCEDURE  You may be given a medicine to help you relax (sedative) before the procedure. You will be given a medicine to make you sleep (general anesthetic) during the procedure. A tube will be put down your throat to help your breath while under general anesthesia. Several small cuts (incisions) are made in the lower abdominal area and one incision is made near the belly button. Your abdominal area will be inflated with a safe gas (carbon dioxide). This helps give the surgeon room to operate, visualize, and helps the surgeon avoid other organs. A thin, lighted tube (laparoscope) with a camera attached is inserted into your abdomen through the incision near the belly button. Other small instruments may also be inserted through other abdominal incisions. The ovary is located and are removed. After the ovary is removed, the gas is released from the abdomen. The incisions will be closed with stitches (sutures), and Dermabond. A bandage may be placed over the incisions.  AFTER THE PROCEDURE  You will also have some mild abdominal discomfort for 3-7 days. You will be given pain medicine to ease any discomfort. As long as there are no problems, you may be allowed to  go home. Someone will need to drive you home and be with you for at least 24 hours once home. You may have some mild discomfort in the throat. This is from the tube placed in your throat while you were sleeping. You may experience discomfort in the shoulder area from some trapped air between the liver and diaphragm. This sensation is normal and will slowly go away on its own.  HOME CARE INSTRUCTIONS  Take all medicines as directed. Only take over-the-counter or prescription medicines for pain, discomfort, or fever as directed by your caregiver. Resume daily activities as directed. Showers are preferred over baths for 2 weeks. You may resume sexual activities in 1 week or as you feel you would like to. Do not drive while taking narcotics.  SEEK MEDICAL CARE IF: . There is increasing abdominal pain. You feel lightheaded or faint. You have the chills. You have an oral temperature above 102 F (38.9 C). There is pus-like (purulent) drainage from any of the wounds. You are unable to pass gas or have a bowel movement. You feel sick to your stomach (nauseous) or throw up (vomit) and can't control it with your medicines.  MAKE SURE YOU:  Understand these instructions. Will watch your condition. Will get help right away if you are not doing well or get worse.  Exit[employer] Patient Information 2013 [employer].      

## 2018-07-10 NOTE — Anesthesia Preprocedure Evaluation (Signed)
Anesthesia Evaluation  Patient identified by MRN, date of birth, ID band Patient awake    Reviewed: Allergy & Precautions, NPO status , Patient's Chart, lab work & pertinent test results  History of Anesthesia Complications Negative for: history of anesthetic complications  Airway Mallampati: III       Dental   Pulmonary neg sleep apnea, neg COPD, Current Smoker,           Cardiovascular hypertension, Pt. on medications (-) Past MI and (-) CHF (-) dysrhythmias (-) Valvular Problems/Murmurs     Neuro/Psych neg Seizures Anxiety Depression Bipolar Disorder    GI/Hepatic Neg liver ROS, GERD (no problems in years)  ,  Endo/Other  neg diabetes  Renal/GU negative Renal ROS     Musculoskeletal   Abdominal   Peds  Hematology   Anesthesia Other Findings   Reproductive/Obstetrics                             Anesthesia Physical Anesthesia Plan  ASA: II  Anesthesia Plan: General   Post-op Pain Management:    Induction: Intravenous  PONV Risk Score and Plan: 2 and Dexamethasone and Ondansetron  Airway Management Planned: Oral ETT  Additional Equipment:   Intra-op Plan:   Post-operative Plan:   Informed Consent: I have reviewed the patients History and Physical, chart, labs and discussed the procedure including the risks, benefits and alternatives for the proposed anesthesia with the patient or authorized representative who has indicated his/her understanding and acceptance.     Plan Discussed with:   Anesthesia Plan Comments:         Anesthesia Quick Evaluation

## 2018-07-10 NOTE — Transfer of Care (Signed)
Immediate Anesthesia Transfer of Care Note  Patient: Tamara Thomas  Procedure(s) Performed: LAPAROSCOPY DIAGNOSTIC (N/A ) LAPAROSCOPIC PARTIAL OOPHORECTOMY (Right ) PELVIC WASHING AND PERITONEAL BIOPSIES (N/A )  Patient Location: PACU  Anesthesia Type:General  Level of Consciousness: drowsy  Airway & Oxygen Therapy: Patient Spontanous Breathing and Patient connected to face mask oxygen  Post-op Assessment: Report given to RN and Post -op Vital signs reviewed and stable  Post vital signs: Reviewed and stable  Last Vitals:  Vitals Value Taken Time  BP 123/76 07/10/2018 10:48 AM  Temp 36.5 C 07/10/2018 10:48 AM  Pulse 108 07/10/2018 10:51 AM  Resp 10 07/10/2018 10:51 AM  SpO2 98 % 07/10/2018 10:51 AM  Vitals shown include unvalidated device data.  Last Pain:  Vitals:   07/10/18 0610  TempSrc: Oral         Complications: No apparent anesthesia complications

## 2018-07-10 NOTE — Interval H&P Note (Signed)
History and Physical Interval Note:  07/10/2018 7:19 AM  Tamara Thomas  has presented today for surgery, with the diagnosis of pelvic pain, complex ovarian cyst  The various methods of treatment have been discussed with the patient and family. After consideration of risks, benefits and other options for treatment, the patient has consented to  Procedure(s): LAPAROSCOPY DIAGNOSTIC (N/A) LAPAROSCOPIC OOPHORECTOMY (Bilateral) PELVIC WASHING AND PERITONEAL BIOPSIES (N/A) and likely RIGHT oophorectomy as a surgical intervention .  The patient's history has been reviewed, patient examined, no change in status, stable for surgery.  I have reviewed the patient's chart and labs.  Questions were answered to the patient's satisfaction.     Benjaman Kindler

## 2018-07-10 NOTE — Anesthesia Postprocedure Evaluation (Signed)
Anesthesia Post Note  Patient: Tamara Thomas  Procedure(s) Performed: LAPAROSCOPY DIAGNOSTIC (N/A ) LAPAROSCOPIC PARTIAL OOPHORECTOMY (Right ) PELVIC WASHING AND PERITONEAL BIOPSIES (N/A )  Patient location during evaluation: PACU Anesthesia Type: General Level of consciousness: awake and alert Pain management: pain level controlled Vital Signs Assessment: post-procedure vital signs reviewed and stable Respiratory status: spontaneous breathing and respiratory function stable Cardiovascular status: stable Anesthetic complications: no     Last Vitals:  Vitals:   07/10/18 0610 07/10/18 1048  BP: 118/90 123/76  Pulse: 91 (!) 119  Resp: 17 14  Temp: 36.4 C 36.5 C  SpO2: 98% 94%    Last Pain:  Vitals:   07/10/18 0610  TempSrc: Oral                 Tykerria Mccubbins K

## 2018-07-10 NOTE — Op Note (Signed)
Tamara Thomas PROCEDURE DATE: 07/10/2018  PREOPERATIVE DIAGNOSIS: Acute onset pelvic pain, bilateral ovarian cysts POSTOPERATIVE DIAGNOSIS: Endometrioma PROCEDURE: Operative laparoscopy, lysis of adhesions for greater than 75% of the case, partial right oophorectomy, right ovarian cystectomy, peritoneal biopsies. SURGEON:  Dr. Benjaman Kindler  General surgery Dr. Hampton Abbot was called into assist with bowel adhesio lysis.  ANESTHESIOLOGIST: Gunnar Fusi, MD Anesthesiologist: Gunnar Fusi, MD CRNA: Aline Brochure, CRNA; Demetrius Charity, CRNA  INDICATIONS: 47 y.o. with history of acute on chronic pelvic pain desiring surgical evaluation, with elevated CA125 >200 that resolved to 54 and improved bilateral ovarian cysts. However, her right ovarian cyst maintained its size and complexity, with internal vascularity. Because her pain continues on the right, we determine for surgical evaluation.  Please see preoperative notes for further details.  Risks of surgery were discussed with the patient including but not limited to: bleeding which may require transfusion or reoperation; infection which may require antibiotics; injury to bowel, bladder, ureters or other surrounding organs; need for additional procedures including laparotomy; thromboembolic phenomenon, incisional problems and other postoperative/anesthesia complications. Written informed consent was obtained.    FINDINGS: Absent uterus and tubes. +right upper abdominal omental adhesions.    In her pelvis, her large bowel was adherent to the left pelvic sidewall, and a redundant loop of bowel and omentum had encased the right ovary entirely.  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  Once the ovary was mobilized, a large chocolate cyst was ruptured and spilled endometrioma fluid into the pelvis.  Peritoneal washings were  taken and sent to pathology. No other abdominal/pelvic abnormality.  Normal upper abdomen.  ANESTHESIA:    General INTRAVENOUS FLUIDS: 1000 ml ESTIMATED BLOOD LOSS: 50 ml URINE OUTPUT: 150 ml SPECIMENS: Portion of right ovary, ovarian cyst wall, pelvic washings  COMPLICATIONS: None immediate  PROCEDURE IN DETAIL:  The patient had sequential compression devices applied to her lower extremities while in the preoperative area.  She was then taken to the operating room where general anesthesia was administered and was found to be adequate.  She was placed in the dorsal lithotomy position, and was prepped and draped in a sterile manner.  A Foley catheter was inserted into her bladder and attached to constant drainage. After an adequate timeout was performed, attention was turned to the abdomen where an umbilical incision was made with the scalpel.  The Optiview 5-mm trocar and sleeve were then advanced without difficulty with the laparoscope under direct visualization into the abdomen.  The abdomen was then insufflated with carbon dioxide gas and adequate pneumoperitoneum was obtained.   A detailed survey of the patient's pelvis and abdomen revealed the findings as mentioned above.  An additional 37mm trocar was placed in the left lower quadrants under direct visualization, and at 53mm trocar was placed in the opposite quadrant under direct visualization.   Evaluation of the pelvic sidewalls to observe the course of the ureters was undertaken, but adhesions limited visualization of the ureteral course. Pelvic peritoneal bx were taken.  Pelvic washings were taken.   Oopherectomy The Right adnexa was elevated away from the pelvic sidewall.  General surgery was called and at this point to assist with dissecting the bowel off of the right pelvic sidewall and finding the edges of the ovary.  This portion of the case took about 2 hours, and using blunt and occasionally sharp dissection with cautery, the right  ovary eventually emerged from underneath the  adhesions.  The IP ligament was identified, and the right ureter was evaluated for.  However, the right ureter was not able to be seen, and the right ovary remained ensconced in the right pelvic sidewall with thick adhesions that were firm enough that we were unable to find an adequate tissue plane.  Retroperitoneal dissection did not help, and the decision was made to remove the portion of the ovary that was safely available.  We included the endometrial cyst in this portion, although we removed the cyst wall that was left over separately.  Ovarian cystectomy The ovarian cyst was evaluated and dissected out from the normal ovarian tissue. Hemostasis was assured with electrocautery. The pelvis was irrigated and all fluid and blood removed.  The operative site was surveyed, and fibula and Arista were applied to the portion of the ovary that remained.  The operative area was found to be hemostatic.  No intraoperative injury to surrounding organs was noted.  Pictures were taken of the quadrants and pelvis. The abdomen was desufflated and all instruments were then removed from the patient's abdomen. The uterine manipulator was removed without complications.  All incisions were closed with 4-0 monocryl and Dermabond.   The patient tolerated the procedures well.  All instruments, needles, and sponge counts were correct x 2. The patient was taken to the recovery room in stable condition.

## 2018-07-13 DIAGNOSIS — N801 Endometriosis of ovary: Secondary | ICD-10-CM

## 2018-07-13 DIAGNOSIS — N80129 Deep endometriosis of ovary, unspecified ovary: Secondary | ICD-10-CM

## 2018-07-13 LAB — CYTOLOGY - NON PAP

## 2018-07-13 LAB — SURGICAL PATHOLOGY

## 2018-07-13 NOTE — Op Note (Signed)
  Procedure Date:  07/13/2018  Pre-operative Diagnosis:  Bilateral ovarian cysts  Post-operative Diagnosis:  Endometrioma  Procedure:  Lysis of adhesions  Surgeon:  Melvyn Neth, MD  Assistant:  Benjaman Kindler, MD  Anesthesia:  General endotracheal  Estimated Blood Loss:  50 ml  Specimens:  Portion of right ovary, ovarian cyst wall  Complications:  None  Indications for Procedure:  This is a 47 y.o. female taken to the [facility] by Dr. Leafy Ro due to pelvic pain and findings of bilateral ovarian cysts.  General Surgery was called as an intraoperative consultation for assistance with lysis of adehsions, as the large colon, small bowel, and omentum were significantly adhered to the right ovary and pelvic sidewall.  Description of Procedure: The surgery was already underway at the time General Surgery was called into the room.  The patient had an umbilical 5 mm port and bilateral lower quadrant 5 mm ports.    The distal colon, segments of small bowel in the pelvis, and omentum were found to be significantly adhered to the pelvic sidewall as well as the right ovary.  Extensive lysis of adhesions was performed using combination of blunt and sharp dissection and cautery.  The colon, small bowel, and omentum were dissected and the right ovary was better evaluated and identified.  During dissection of the right ovary, a large chocolate cyst was ruptured, consistent with endometrioma.    The right ovary was not able to be fully mobilized.  The right ureter was searched for but not identified due to the significant adhesions.  At this point, it was decided to partially excise the mobilized portion of the right ovary including the cyst wall.  This was taken using LigaSure in piecemeal fashion.  Cautery was used for hemostasis as well as Fibrillar and Arista.  This concluded my involvement in the procedure.  Please see Dr. Migdalia Dk operative note for further details.   Melvyn Neth, MD

## 2018-07-15 DIAGNOSIS — Z5181 Encounter for therapeutic drug level monitoring: Secondary | ICD-10-CM | POA: Diagnosis not present

## 2018-11-25 DIAGNOSIS — G629 Polyneuropathy, unspecified: Secondary | ICD-10-CM | POA: Diagnosis not present

## 2018-11-25 DIAGNOSIS — R7303 Prediabetes: Secondary | ICD-10-CM | POA: Diagnosis not present

## 2018-11-25 DIAGNOSIS — E669 Obesity, unspecified: Secondary | ICD-10-CM | POA: Diagnosis not present

## 2018-11-25 DIAGNOSIS — I10 Essential (primary) hypertension: Secondary | ICD-10-CM | POA: Diagnosis not present

## 2018-11-27 ENCOUNTER — Other Ambulatory Visit: Payer: Self-pay | Admitting: Family Medicine

## 2018-11-27 DIAGNOSIS — Z1231 Encounter for screening mammogram for malignant neoplasm of breast: Secondary | ICD-10-CM

## 2018-12-02 ENCOUNTER — Ambulatory Visit
Admission: RE | Admit: 2018-12-02 | Discharge: 2018-12-02 | Disposition: A | Discharge status: Home / Self Care | Payer: 59 | Source: Ambulatory Visit | Attending: Family Medicine | Admitting: Family Medicine

## 2018-12-02 ENCOUNTER — Other Ambulatory Visit: Payer: Self-pay

## 2018-12-02 DIAGNOSIS — Z1231 Encounter for screening mammogram for malignant neoplasm of breast: Secondary | ICD-10-CM

## 2018-12-23 DIAGNOSIS — G2581 Restless legs syndrome: Secondary | ICD-10-CM | POA: Diagnosis not present

## 2018-12-23 DIAGNOSIS — R51 Headache: Secondary | ICD-10-CM | POA: Diagnosis not present

## 2018-12-23 DIAGNOSIS — M79671 Pain in right foot: Secondary | ICD-10-CM | POA: Diagnosis not present

## 2019-07-15 ENCOUNTER — Encounter: Payer: Self-pay | Admitting: Psychiatry

## 2019-07-15 ENCOUNTER — Ambulatory Visit: Payer: 59 | Admitting: Psychiatry

## 2019-07-15 ENCOUNTER — Other Ambulatory Visit: Payer: Self-pay

## 2019-07-15 ENCOUNTER — Ambulatory Visit (INDEPENDENT_AMBULATORY_CARE_PROVIDER_SITE_OTHER): Payer: 59 | Admitting: Psychiatry

## 2019-07-15 DIAGNOSIS — F3342 Major depressive disorder, recurrent, in full remission: Secondary | ICD-10-CM | POA: Diagnosis not present

## 2019-07-15 DIAGNOSIS — F3341 Major depressive disorder, recurrent, in partial remission: Secondary | ICD-10-CM | POA: Insufficient documentation

## 2019-07-15 NOTE — Progress Notes (Signed)
Psychiatric Initial Adult Assessment   I connected with  Christina Koelsch-Morales on 07/15/19 by a video enabled telemedicine application and verified that I am speaking with the correct person using two identifiers.   I discussed the limitations of evaluation and management by telemedicine. The patient expressed understanding and agreed to proceed.   Patient Identification: Tamara Thomas MRN:  [id number] Date of Evaluation:  07/15/2019   Referral Source: PCP Dr. Richarda Overlie  Chief Complaint:   Pratt; Other; Depression     Visit Diagnosis: MDD (major depressive disorder), recurrent, in full remission ([place])    History of Present Illness:  Pt has a long standing hx of depression and she was seeing a psychiatrist with [facility]. However, psychiatrist left the practice so pt is being seen by [place]. Pt reported that she has been stable on her current med regimen of Cymbalta 90 mg daily, Abilify 30 mg daily and Lamical 100 mg daily. She reported doing very well. She denied any side effects or other concerns pertaining to her medications. She feels that this combination has done wonders for her and she has no complaints. She denied any ongoing stressors. Her work is going well and everyone in her family are doing well too.  She is able to sleep well. She enjoys rading and spending time with family.  Associated Signs/Symptoms: Depression Symptoms:  denied (Hypo) Manic Symptoms:  denied Anxiety Symptoms:  denied Psychotic Symptoms:  denied PTSD Symptoms: denied  Past Psychiatric History: MDD  Previous Psychotropic Medications: Yes see above for list  Substance Abuse History in the last 12 months:  No.  Consequences of Substance Abuse: Negative  Past Medical History:  Past Medical History:  Diagnosis Date  . Abdominal pain   . Anxiety   . Bipolar disorder ([place])   . Depression   . GERD (gastroesophageal reflux disease)   . Hypertension   .  Restless leg syndrome     Past Surgical History:  Procedure Laterality Date  . BARIATRIC SURGERY    . CHOLECYSTECTOMY    . GALLBLADDER SURGERY    . LAPAROSCOPY N/A 07/10/2018   Procedure: LAPAROSCOPY DIAGNOSTIC;  Surgeon: Benjaman Kindler, MD;  Location: [facility] ORS;  Service: Gynecology;  Laterality: N/A;  . PELVIC LYMPH NODE DISSECTION N/A 07/10/2018   Procedure: PELVIC WASHING AND PERITONEAL BIOPSIES;  Surgeon: Benjaman Kindler, MD;  Location: [facility] ORS;  Service: Gynecology;  Laterality: N/A;  . TONSILLECTOMY      Family Psychiatric History: depression maternal side of family  Family History:  Family History  Problem Relation Age of Onset  . Dementia Mother   . Hyperlipidemia Mother   . Depression Mother   . Hypertension Father   . Diabetes Father   . Hyperlipidemia Father   . Breast cancer Neg Hx     Social History:   Social History   Socioeconomic History  . Marital status: Married    Spouse name: JR  . Number of children: 0  . Years of education: Not on file  . Highest education level: Some college, no degree  Occupational History  . Not on file  Social Needs  . Financial resource strain: Not hard at all  . Food insecurity    Worry: Never true    Inability: Never true  . Transportation needs    Medical: No    Non-medical: No  Tobacco Use  . Smoking status: Former Smoker    Packs/day: 0.50    Years: 30.00  Pack years: 15.00    Types: Cigarettes    Start date: 03/23/1987    Quit date: 11/14/2018    Years since quitting: 0.6  . Smokeless tobacco: Never Used  Substance and Sexual Activity  . Alcohol use: Not Currently    Alcohol/week: 0.0 standard drinks  . Drug use: No  . Sexual activity: Yes  Lifestyle  . Physical activity    Days per week: 0 days    Minutes per session: 0 min  . Stress: Not at all  Relationships  . Social Herbalist on phone: Not on file    Gets together: Not on file    Attends religious service: More than 4 times per  year    Active member of club or organization: No    Attends meetings of clubs or organizations: Never    Relationship status: Married  Other Topics Concern  . Not on file  Social History Narrative  . 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 Erythromycin Nausea Only    Metabolic Disorder Labs: No results found for: HGBA1C, MPG No results found for: PROLACTIN No results found for: CHOL, TRIG, HDL, CHOLHDL, VLDL, LDLCALC No results found for: TSH  Therapeutic Level Labs: No results found for: LITHIUM No results found for: CBMZ No results found for: VALPROATE  Current Medications: Current Outpatient Medications  Medication Sig Dispense Refill  . amLODipine (NORVASC) 10 MG tablet Take 10 mg by mouth at bedtime.   0  . ARIPiprazole (ABILIFY) 30 MG tablet Take 30 mg by mouth at bedtime.  1  . cyclobenzaprine (FLEXERIL) 10 MG tablet TAKE 1 TABLET BY MOUTH AS NEEDED FOR MUSCLE SPASMS    . DULoxetine (CYMBALTA) 30 MG capsule Take 1 capsule (30 mg total) by mouth daily. (Patient taking differently: Take 90 mg by mouth daily. ) 30 capsule 1  . gabapentin (NEURONTIN) 300 MG capsule Take 300 mg by mouth once.    . lamoTRIgine (LAMICTAL) 100 MG tablet Take 100 mg by mouth daily.  1  . pramipexole (MIRAPEX) 0.5 MG tablet Take 0.5 mg by mouth every evening.   4   No current facility-administered medications for this visit.     Musculoskeletal: Strength & Muscle Tone: unable to assess due to telemed visit Gait & Station: unable to assess due to telemed visit Patient leans: unable to assess due to telemed visit  Psychiatric Specialty Exam: ROS  Last menstrual period 12/25/2015.There is no height or weight on file to calculate BMI.  General Appearance: Fairly Groomed  Eye Contact:  Good  Speech:  Clear and Coherent and Normal Rate  Volume:  Normal  Mood:  Euthymic  Affect:  Congruent  Thought Process:  Goal  Directed, Linear and Descriptions of Associations: Intact  Orientation:  Full (Time, Place, and Person)  Thought Content:  Logical  Suicidal Thoughts:  No  Homicidal Thoughts:  No  Memory:  Immediate;   Good Recent;   Good Remote;   Good  Judgement:  Fair  Insight:  Fair  Psychomotor Activity:  Normal  Concentration:  Concentration: Good and Attention Span: Good  Recall:  Good  Fund of Knowledge:Good  Language: Good  Akathisia:  No  Handed:  Right  AIMS (if indicated):  Not done  Assets:  Communication Skills Desire for Improvement Financial Resources/Insurance [place] Talents/Skills Transportation Vocational/Educational  ADL's:  Intact  Cognition: WNL  Sleep:  Good   Screenings:   Assessment and Plan: 48 year old female with long hx of depression now seen for establishing care after her previous psychiatrist left the clinic. Pt reported doing well on her current medications. Pt stated that she does not need refills at this time.  MDD (major depressive disorder), recurrent, in full remission ([facility])  Continue Abilify 30 mg HS Continue Cymbalta 90 mg daily Continue Lamictal 100 mg daily.  F/up in 2 months.  [place] Crane, MD 10/22/20201:30 PM

## 2019-09-08 ENCOUNTER — Ambulatory Visit (INDEPENDENT_AMBULATORY_CARE_PROVIDER_SITE_OTHER): Payer: 59 | Admitting: Psychiatry

## 2019-09-08 ENCOUNTER — Other Ambulatory Visit: Payer: Self-pay

## 2019-09-08 ENCOUNTER — Encounter: Payer: Self-pay | Admitting: Psychiatry

## 2019-09-08 DIAGNOSIS — F3342 Major depressive disorder, recurrent, in full remission: Secondary | ICD-10-CM | POA: Diagnosis not present

## 2019-09-08 MED ORDER — DULOXETINE HCL 30 MG PO CPEP: ORAL_CAPSULE | ORAL | 0 refills | Status: DC

## 2019-09-08 MED ORDER — ARIPIPRAZOLE 30 MG PO TABS: 30.0000 mg | ORAL_TABLET | Freq: Every day | ORAL | 0 refills | Status: DC

## 2019-09-08 MED ORDER — LAMOTRIGINE 100 MG PO TABS: 100.0000 mg | ORAL_TABLET | Freq: Every day | ORAL | 0 refills | Status: DC

## 2019-09-08 NOTE — Progress Notes (Signed)
[facility] MD OP Progress Note  09/08/2019 10:01 AM Tamara Thomas  MRN:  [id number]  Chief Complaint:  " I am doing great."  HPI: Patient stated that she is continuing to do well on her current medication regimen.  She denied any acute stressors.  She denied any side effects.  She informed that she and her family were able to visit her extended family in [place] around Thanksgiving.  Family is planning to stay home for Christmas.  She denied any concerns at this time.  Visit Diagnosis:    ICD-10-CM   1. MDD (major depressive disorder), recurrent, in full remission ([place])  F33.42     Past Psychiatric History: Depression  Past Medical History:  Past Medical History:  Diagnosis Date  . Abdominal pain   . Anxiety   . Bipolar disorder ([place])   . Depression   . GERD (gastroesophageal reflux disease)   . Hypertension   . Restless leg syndrome     Past Surgical History:  Procedure Laterality Date  . BARIATRIC SURGERY    . CHOLECYSTECTOMY    . GALLBLADDER SURGERY    . LAPAROSCOPY N/A 07/10/2018   Procedure: LAPAROSCOPY DIAGNOSTIC;  Surgeon: Benjaman Kindler, MD;  Location: [facility] ORS;  Service: Gynecology;  Laterality: N/A;  . PELVIC LYMPH NODE DISSECTION N/A 07/10/2018   Procedure: PELVIC WASHING AND PERITONEAL BIOPSIES;  Surgeon: Benjaman Kindler, MD;  Location: [facility] ORS;  Service: Gynecology;  Laterality: N/A;  . TONSILLECTOMY      Family Psychiatric History: see below  Family History:  Family History  Problem Relation Age of Onset  . Dementia Mother   . Hyperlipidemia Mother   . Depression Mother   . Hypertension Father   . Diabetes Father   . Hyperlipidemia Father   . Breast cancer Neg Hx     Social History:  Social History   Socioeconomic History  . Marital status: Married    Spouse name: JR  . Number of children: 0  . Years of education: Not on file  . Highest education level: Some college, no degree  Occupational History  . Not on file  Tobacco Use  .  Smoking status: Former Smoker    Packs/day: 0.50    Years: 30.00    Pack years: 15.00    Types: Cigarettes    Start date: 03/23/1987    Quit date: 11/14/2018    Years since quitting: 0.8  . Smokeless tobacco: Never Used  Substance and Sexual Activity  . Alcohol use: Not Currently    Alcohol/week: 0.0 standard drinks  . Drug use: No  . Sexual activity: Yes  Other Topics Concern  . Not on file  Social History Narrative  . Not on file   Social Determinants of Health   Financial Resource Strain: Low Risk   . Difficulty of Paying Living Expenses: Not hard at all  Food Insecurity: No Food Insecurity  . Worried About Charity fundraiser in the Last Year: Never true  . Ran Out of Food in the Last Year: Never true  Transportation Needs: No Transportation Needs  . Lack of Transportation (Medical): No  . Lack of Transportation (Non-Medical): No  Physical Activity: Inactive  . Days of Exercise per Week: 0 days  . Minutes of Exercise per Session: 0 min  Stress: No Stress Concern Present  . Feeling of Stress : Not at all  Social Connections: Unknown  . Frequency of Communication with Friends and Family: Not on file  . Frequency of Social  Gatherings with Friends and Family: Not on file  . Attends Religious Services: More than 4 times per year  . Active Member of Clubs or Organizations: No  . Attends Archivist Meetings: Never  . Marital Status: Married    Allergies:  Allergies  Allergen Reactions  . Erythromycin Nausea Only    Metabolic Disorder Labs: No results found for: HGBA1C, MPG No results found for: PROLACTIN No results found for: CHOL, TRIG, HDL, CHOLHDL, VLDL, LDLCALC No results found for: TSH  Therapeutic Level Labs: No results found for: LITHIUM No results found for: VALPROATE No components found for:  CBMZ  Current Medications: Current Outpatient Medications  Medication Sig Dispense Refill  . amLODipine (NORVASC) 10 MG tablet Take 10 mg by mouth at  bedtime.   0  . ARIPiprazole (ABILIFY) 30 MG tablet Take 30 mg by mouth at bedtime.  1  . cyclobenzaprine (FLEXERIL) 10 MG tablet TAKE 1 TABLET BY MOUTH AS NEEDED FOR MUSCLE SPASMS    . DULoxetine (CYMBALTA) 30 MG capsule Take 1 capsule (30 mg total) by mouth daily. (Patient taking differently: Take 90 mg by mouth daily. ) 30 capsule 1  . gabapentin (NEURONTIN) 300 MG capsule Take 300 mg by mouth once.    . lamoTRIgine (LAMICTAL) 100 MG tablet Take 100 mg by mouth daily.  1  . pramipexole (MIRAPEX) 0.5 MG tablet Take 0.5 mg by mouth every evening.   4   No current facility-administered medications for this visit.     Musculoskeletal: Strength & Muscle Tone: unable to assess due to telemed visit Gait & Station: unable to assess due to telemed visit Patient leans: unable to assess due to telemed visit  Psychiatric Specialty Exam: Review of Systems  Last menstrual period 12/25/2015.There is no height or weight on file to calculate BMI.  General Appearance: Fairly Groomed  Eye Contact:  Good  Speech:  Clear and Coherent and Normal Rate  Volume:  Normal  Mood:  Euthymic  Affect:  Congruent  Thought Process:  Goal Directed, Linear and Descriptions of Associations: Intact  Orientation:  Full (Time, Place, and Person)  Thought Content: Logical   Suicidal Thoughts:  No  Homicidal Thoughts:  No  Memory:  Recent;   Good Remote;   Good  Judgement:  Good  Insight:  Good  Psychomotor Activity:  Normal  Concentration:  Concentration: Good and Attention Span: Good  Recall:  Good  Fund of Knowledge: Good  Language: Good  Akathisia:  Negative  Handed:  Right  AIMS (if indicated): not done  Assets:  Communication Skills Desire for Improvement Financial Resources/Insurance Housing Social Support  ADL's:  Intact  Cognition: WNL  Sleep:  Good     Assessment and Plan: Patient noted to be stable on her current medication regimen.  1. MDD (major depressive disorder), recurrent, in full  remission ([place])  - ARIPiprazole (ABILIFY) 30 MG tablet; Take 1 tablet (30 mg total) by mouth at bedtime.  Dispense: 90 tablet; Refill: 0 - lamoTRIgine (LAMICTAL) 100 MG tablet; Take 1 tablet (100 mg total) by mouth daily.  Dispense: 90 tablet; Refill: 0 - DULoxetine (CYMBALTA) 30 MG capsule; Take 3 tablets daily  Dispense: 270 capsule; Refill: 0   Continue same medication regimen. Follow up in 3 months.   [place] Crane, MD 09/08/2019, 10:01 AM

## 2019-10-15 ENCOUNTER — Telehealth: Payer: Self-pay

## 2019-10-15 MED ORDER — DULOXETINE HCL 60 MG PO CPEP: ORAL_CAPSULE | ORAL | 0 refills | Status: DC

## 2019-10-15 MED ORDER — DULOXETINE HCL 30 MG PO CPEP: ORAL_CAPSULE | ORAL | 0 refills | Status: DC

## 2019-10-15 NOTE — Telephone Encounter (Signed)
prior auth needed for the duloxetine 30 mg take 3 capsules by mouth every day.  (just a FYI may need to send in a duloxetine 60mg  and a 30mg )

## 2019-10-15 NOTE — Telephone Encounter (Signed)
  prior Josem Kaufmann was submitted and is pending   (fyi you may need to send in a 60mg  and a 30mg  to cover)

## 2019-10-15 NOTE — Telephone Encounter (Signed)
Done

## 2019-10-15 NOTE — Telephone Encounter (Signed)
denied more than one capsule.  please send in a 60mg  and a 30mg  it should go threw.

## 2019-12-01 ENCOUNTER — Ambulatory Visit (INDEPENDENT_AMBULATORY_CARE_PROVIDER_SITE_OTHER): Payer: 59 | Admitting: Psychiatry

## 2019-12-01 ENCOUNTER — Other Ambulatory Visit: Payer: Self-pay

## 2019-12-01 ENCOUNTER — Encounter: Payer: Self-pay | Admitting: Psychiatry

## 2019-12-01 DIAGNOSIS — F3342 Major depressive disorder, recurrent, in full remission: Secondary | ICD-10-CM

## 2019-12-01 NOTE — Progress Notes (Signed)
[place] MD OP Progress Note  I connected with  Tamara Thomas on 12/01/19 by a video enabled telemedicine application and verified that I am speaking with the correct person using two identifiers.   I discussed the limitations of evaluation and management by telemedicine. The patient expressed understanding and agreed to proceed.    12/01/2019 8:43 AM Tamara Thomas  MRN:  [id number]  Chief Complaint:  " I am doing okay but have noticed some days of depressed mood."  HPI: Patient stated that overall things are progressing well for her.  She has noticed that she does feel depressed on a few days especially when she does not exercise.  She stated that as a result she has been exercising pretty regularly and that seems to help her mood immensely.  She stated that she had a good time in [place] when they went for a visit a few months ago.  She stated she still working from home virtually and she likes that. Patient also brought up that she has a new insurance now and they had mentioned something about Cymbalta 270 tablets/month being more than the cover.  However she has prescription filled from her previous insurance and she is good for the next 3 to 4 months.  Patient was informed that that generally implies they would cover Cymbalta 60 mg +30 mg 90 tablets each for future.   Visit Diagnosis:    ICD-10-CM   1. MDD (major depressive disorder), recurrent, in full remission ([place])  F33.42     Past Psychiatric History: Depression  Past Medical History:  Past Medical History:  Diagnosis Date  . Abdominal pain   . Anxiety   . Bipolar disorder ([place])   . Depression   . GERD (gastroesophageal reflux disease)   . Hypertension   . Restless leg syndrome     Past Surgical History:  Procedure Laterality Date  . BARIATRIC SURGERY    . CHOLECYSTECTOMY    . GALLBLADDER SURGERY    . LAPAROSCOPY N/A 07/10/2018   Procedure: LAPAROSCOPY DIAGNOSTIC;  Surgeon: Benjaman Kindler, MD;   Location: [facility] ORS;  Service: Gynecology;  Laterality: N/A;  . PELVIC LYMPH NODE DISSECTION N/A 07/10/2018   Procedure: PELVIC WASHING AND PERITONEAL BIOPSIES;  Surgeon: Benjaman Kindler, MD;  Location: [facility] ORS;  Service: Gynecology;  Laterality: N/A;  . TONSILLECTOMY      Family Psychiatric History: see below  Family History:  Family History  Problem Relation Age of Onset  . Dementia Mother   . Hyperlipidemia Mother   . Depression Mother   . Hypertension Father   . Diabetes Father   . Hyperlipidemia Father   . Breast cancer Neg Hx     Social History:  Social History   Socioeconomic History  . Marital status: Married    Spouse name: JR  . Number of children: 0  . Years of education: Not on file  . Highest education level: Some college, no degree  Occupational History  . Not on file  Tobacco Use  . Smoking status: Former Smoker    Packs/day: 0.50    Years: 30.00    Pack years: 15.00    Types: Cigarettes    Start date: 03/23/1987    Quit date: 11/14/2018    Years since quitting: 1.0  . Smokeless tobacco: Never Used  Substance and Sexual Activity  . Alcohol use: Not Currently    Alcohol/week: 0.0 standard drinks  . Drug use: No  . Sexual activity: Yes  Other Topics Concern  .  Not on file  Social History Narrative  . Not on file   Social Determinants of Health   Financial Resource Strain: Low Risk   . Difficulty of Paying Living Expenses: Not hard at all  Food Insecurity: No Food Insecurity  . Worried About Charity fundraiser in the Last Year: Never true  . Ran Out of Food in the Last Year: Never true  Transportation Needs: No Transportation Needs  . Lack of Transportation (Medical): No  . Lack of Transportation (Non-Medical): No  Physical Activity: Inactive  . Days of Exercise per Week: 0 days  . Minutes of Exercise per Session: 0 min  Stress: No Stress Concern Present  . Feeling of Stress : Not at all  Social Connections: Unknown  . Frequency of  Communication with Friends and Family: Not on file  . Frequency of Social Gatherings with Friends and Family: Not on file  . Attends Religious Services: More than 4 times per year  . Active Member of Clubs or Organizations: No  . Attends Archivist Meetings: Never  . Marital Status: Married    Allergies:  Allergies  Allergen Reactions  . Erythromycin Nausea Only    Metabolic Disorder Labs: No results found for: HGBA1C, MPG No results found for: PROLACTIN No results found for: CHOL, TRIG, HDL, CHOLHDL, VLDL, LDLCALC No results found for: TSH  Therapeutic Level Labs: No results found for: LITHIUM No results found for: VALPROATE No components found for:  CBMZ  Current Medications: Current Outpatient Medications  Medication Sig Dispense Refill  . amLODipine (NORVASC) 10 MG tablet Take 10 mg by mouth at bedtime.   0  . ARIPiprazole (ABILIFY) 30 MG tablet Take 1 tablet (30 mg total) by mouth at bedtime. 90 tablet 0  . cyclobenzaprine (FLEXERIL) 10 MG tablet TAKE 1 TABLET BY MOUTH AS NEEDED FOR MUSCLE SPASMS    . DULoxetine (CYMBALTA) 30 MG capsule Take 30 mg capsule with 60 mg capsule (90 mg) daily 90 capsule 0  . DULoxetine (CYMBALTA) 60 MG capsule Take 60 mg capsule with 30 mg capsule (90 mg) daily 90 capsule 0  . gabapentin (NEURONTIN) 300 MG capsule Take 300 mg by mouth once.    . lamoTRIgine (LAMICTAL) 100 MG tablet Take 1 tablet (100 mg total) by mouth daily. 90 tablet 0  . pramipexole (MIRAPEX) 0.5 MG tablet Take 0.5 mg by mouth every evening.   4   No current facility-administered medications for this visit.     Musculoskeletal: Strength & Muscle Tone: unable to assess due to telemed visit Gait & Station: unable to assess due to telemed visit Patient leans: unable to assess due to telemed visit  Psychiatric Specialty Exam: Review of Systems   Last menstrual period 12/25/2015.There is no height or weight on file to calculate BMI.  General Appearance: Fairly  Groomed  Eye Contact:  Good  Speech:  Clear and Coherent and Normal Rate  Volume:  Normal  Mood:  Euthymic  Affect:  Congruent  Thought Process:  Goal Directed, Linear and Descriptions of Associations: Intact  Orientation:  Full (Time, Place, and Person)  Thought Content: Logical   Suicidal Thoughts:  No  Homicidal Thoughts:  No  Memory:  Recent;   Good Remote;   Good  Judgement:  Good  Insight:  Good  Psychomotor Activity:  Normal  Concentration:  Concentration: Good and Attention Span: Good  Recall:  Good  Fund of Knowledge: Good  Language: Good  Akathisia:  Negative  Handed:  Right  AIMS (if indicated): not done  Assets:  Communication Skills Desire for Improvement Financial Resources/Insurance Housing Social Support  ADL's:  Intact  Cognition: WNL  Sleep:  Good     Assessment and Plan: Patient noted to be overall stable on her current medication regimen.  She complained of a few days of feeling sad but that is generally helped by her exercise regimen.  1. MDD (major depressive disorder), recurrent, in full remission ([place])  - ARIPiprazole (ABILIFY) 30 MG tablet; Take 1 tablet (30 mg total) by mouth at bedtime.  Dispense: 90 tablet; Refill: 0 - lamoTRIgine (LAMICTAL) 100 MG tablet; Take 1 tablet (100 mg total) by mouth daily.  Dispense: 90 tablet; Refill: 0 - DULoxetine (CYMBALTA) 30 MG capsule; Take 3 tablets daily  Dispense: 270 capsule; Refill: 0   Continue same medication regimen. Follow up in 3 months.   Nev[place] Crane, MD 12/01/2019, 8:43 AM

## 2019-12-30 ENCOUNTER — Encounter: Payer: BC Managed Care – PPO | Attending: Family Medicine | Admitting: Dietician

## 2019-12-30 ENCOUNTER — Other Ambulatory Visit: Payer: Self-pay

## 2019-12-30 ENCOUNTER — Encounter: Payer: Self-pay | Admitting: Dietician

## 2019-12-30 VITALS — Ht 63.0 in | Wt 231.2 lb

## 2019-12-30 DIAGNOSIS — Z6841 Body Mass Index (BMI) 40.0 and over, adult: Secondary | ICD-10-CM | POA: Diagnosis not present

## 2019-12-30 NOTE — Progress Notes (Signed)
Medical Nutrition Therapy: Visit start time: 1500  end time: 1600  Assessment:  Diagnosis: pre-diabetes, obesity Past medical history: HTN  Psychosocial issues/ stress concerns: none  Preferred learning method:  . Visual  Current weight: 231 lbs Height: 5'3" Medications, supplements: reconciled list in medical record  Progress and evaluation:  Pt reports snacking when she is bored.  Pt states she quit smoking one year ago.   Pt reports working from home. Pt states that smoking cessation and lots of time at home have increased snacking and portions.  Pt reports that husband prepares most of the meals and does not prepare many vegetables.   Physical activity: walk 5 days a week for 45 minutes on the treadmill  Dietary Intake:  Usual eating pattern includes 3 meals and 2 snacks per day. Dining out frequency: 6 meals per week.  Breakfast (9am): Kashi cereal/ham and egg biscuit/eat out 3 times a week Snack: nuts, cheese, fruit, hummus, veggies Lunch: veggie salad with cheese/peanut butter sandwich on whole wheat bread/italian sub with lettuce and tomatoe Snack: same as above  Supper: rice/quinoa/cous cous and beans, eat out 3 times a week Snack: same as above  Beverages: unsweetened tea and water  Nutrition Care Education:  Basic nutrition: basic food groups, appropriate nutrient balance, appropriate meal and snack schedule, general nutrition guidelines    Weight control: importance of low sugar and low fat choices, portion control strategies, estimated energy needs at 2000 kcal  Advanced nutrition:  recipe modification, cooking techniques, dining out, food label reading Pre-diabetes:  appropriate meal and snack schedule, appropriate carb intake and balance, healthy carb choices, role of fiber, protein, fat Hypertension:  importance of controlling BP, identifying high sodium foods, identifying food sources of potassium, magnesium Other lifestyle changes:  benefits of making changes,  increasing motivation, readiness for change, identifying habits that need to change  Nutritional Diagnosis:  [place]-3.3 Overweight/obesity As related to excess calories.  As evidenced by pt with current BMI 40.96. Marland Kitchen  Intervention:   Instruction and discussion as noted above  Pt exercises regularly and is motivated to make dietary changes   Established goals for additional change   Education Materials given:  . General diet guidelines for Diabetes . Plate Planner with Food Lists  . Dining out resource . Sample meal ideas  . Snacking handout . Goals/ instructions   Learner/ who was taught:  . Patient   Level of understanding: Marland Kitchen Verbalizes/ demonstrates competency  Demonstrated degree of understanding via:   Teach back Learning barriers: . None  Willingness to learn/ readiness for change: . Eager, change in progress  Monitoring and Evaluation:  Dietary intake, exercise, and body weight      follow up: Wednesday, May 12th at 3pm

## 2019-12-30 NOTE — Patient Instructions (Addendum)
   Be mindful of portions   Consider tracking food intake with a free food app (ex. myfitnesspal, lose it)  Find fun activities to replace evening snacking with  Incorporate more vegetables and fruits, start with simple options (watch sugar and sodium content)

## 2020-01-24 ENCOUNTER — Telehealth: Payer: Self-pay

## 2020-01-24 DIAGNOSIS — F3342 Major depressive disorder, recurrent, in full remission: Secondary | ICD-10-CM

## 2020-01-24 MED ORDER — ARIPIPRAZOLE 30 MG PO TABS: 30.0000 mg | ORAL_TABLET | Freq: Every day | ORAL | 0 refills | Status: DC

## 2020-01-24 NOTE — Telephone Encounter (Signed)
Done

## 2020-01-24 NOTE — Telephone Encounter (Signed)
recevied a fax requesting a refill on the aripiprazole 30mg 

## 2020-02-02 ENCOUNTER — Ambulatory Visit: Payer: BC Managed Care – PPO | Admitting: Dietician

## 2020-02-23 ENCOUNTER — Telehealth (INDEPENDENT_AMBULATORY_CARE_PROVIDER_SITE_OTHER): Payer: BC Managed Care – PPO | Admitting: Psychiatry

## 2020-02-23 ENCOUNTER — Telehealth: Payer: Self-pay | Admitting: Psychiatry

## 2020-02-23 ENCOUNTER — Encounter (HOSPITAL_COMMUNITY): Payer: Self-pay | Admitting: Psychiatry

## 2020-02-23 ENCOUNTER — Other Ambulatory Visit: Payer: Self-pay

## 2020-02-23 DIAGNOSIS — F3342 Major depressive disorder, recurrent, in full remission: Secondary | ICD-10-CM | POA: Diagnosis not present

## 2020-02-23 MED ORDER — LAMOTRIGINE 100 MG PO TABS: 100.0000 mg | ORAL_TABLET | Freq: Every day | ORAL | 0 refills | Status: DC

## 2020-02-23 MED ORDER — DULOXETINE HCL 30 MG PO CPEP: ORAL_CAPSULE | ORAL | 0 refills | Status: DC

## 2020-02-23 MED ORDER — ARIPIPRAZOLE 30 MG PO TABS: 30.0000 mg | ORAL_TABLET | Freq: Every day | ORAL | 0 refills | Status: DC

## 2020-02-23 MED ORDER — DULOXETINE HCL 60 MG PO CPEP: ORAL_CAPSULE | ORAL | 0 refills | Status: DC

## 2020-02-23 MED ORDER — DOXEPIN HCL 10 MG PO CAPS: ORAL_CAPSULE | ORAL | 1 refills | Status: DC

## 2020-02-23 NOTE — Progress Notes (Signed)
[place] MD OP Progress Note  Virtual Visit via Video Note  I connected with Tamara Thomas on 02/23/20 at  8:30 AM EDT by a video enabled telemedicine application and verified that I am speaking with the correct person using two identifiers.  Location: Patient: Home Provider: Clinic   I discussed the limitations of evaluation and management by telemedicine and the availability of in person appointments. The patient expressed understanding and agreed to proceed.  I provided 17 minutes of non-face-to-face time during this encounter.      02/23/2020 8:32 AM Tamara Thomas  MRN:  [id number]  Chief Complaint:  " I am having a hard time with sleep."  HPI: Patient reported that she has noticed that lately she has been having a difficult time falling asleep and also staying asleep.  She stated that she has low-dose trazodone prescribed by her PCP from the past.  She informed that when she takes half a tablet she is able to get a couple of hours of sleep however when she takes a whole tablet of trazodone she feels very sleepy the next morning. She asked for any other medication option.  She was offered doxepin to help with insomnia. Patient also informed that she still has some days when she feels sad for couple of days here and there.  She informed that these periods do not last for too long.  She is not sure if this is her depression or just regular life routine.  She does believe that these episodes are situational related.   She is not certain of the patterns.  Patient was advised to monitor her mood and to touch base in a couple of months so that we can address this by adjusting her medication as needed.  Patient was agreeable to this plan.   Visit Diagnosis:    ICD-10-CM   1. MDD (major depressive disorder), recurrent, in full remission ([place])  F33.42     Past Psychiatric History: Depression  Past Medical History:  Past Medical History:  Diagnosis Date  . Abdominal  pain   . Anxiety   . Bipolar disorder ([place])   . Depression   . GERD (gastroesophageal reflux disease)   . Hypertension   . Restless leg syndrome     Past Surgical History:  Procedure Laterality Date  . BARIATRIC SURGERY    . CHOLECYSTECTOMY    . GALLBLADDER SURGERY    . LAPAROSCOPY N/A 07/10/2018   Procedure: LAPAROSCOPY DIAGNOSTIC;  Surgeon: Benjaman Kindler, MD;  Location: [facility] ORS;  Service: Gynecology;  Laterality: N/A;  . PELVIC LYMPH NODE DISSECTION N/A 07/10/2018   Procedure: PELVIC WASHING AND PERITONEAL BIOPSIES;  Surgeon: Benjaman Kindler, MD;  Location: [facility] ORS;  Service: Gynecology;  Laterality: N/A;  . TONSILLECTOMY      Family Psychiatric History: see below  Family History:  Family History  Problem Relation Age of Onset  . Dementia Mother   . Hyperlipidemia Mother   . Depression Mother   . Hypertension Father   . Diabetes Father   . Hyperlipidemia Father   . Breast cancer Neg Hx     Social History:  Social History   Socioeconomic History  . Marital status: Married    Spouse name: JR  . Number of children: 0  . Years of education: Not on file  . Highest education level: Some college, no degree  Occupational History  . Not on file  Tobacco Use  . Smoking status: Former Smoker    Packs/day: 0.50  Years: 30.00    Pack years: 15.00    Types: Cigarettes    Start date: 03/23/1987    Quit date: 11/14/2018    Years since quitting: 1.2  . Smokeless tobacco: Never Used  Substance and Sexual Activity  . Alcohol use: Not Currently    Alcohol/week: 0.0 standard drinks  . Drug use: No  . Sexual activity: Yes  Other Topics Concern  . Not on file  Social History Narrative  . Not on file   Social Determinants of Health   Financial Resource Strain: Low Risk   . Difficulty of Paying Living Expenses: Not hard at all  Food Insecurity: No Food Insecurity  . Worried About Charity fundraiser in the Last Year: Never true  . Ran Out of Food in the Last  Year: Never true  Transportation Needs: No Transportation Needs  . Lack of Transportation (Medical): No  . Lack of Transportation (Non-Medical): No  Physical Activity: Inactive  . Days of Exercise per Week: 0 days  . Minutes of Exercise per Session: 0 min  Stress: No Stress Concern Present  . Feeling of Stress : Not at all  Social Connections: Unknown  . Frequency of Communication with Friends and Family: Not on file  . Frequency of Social Gatherings with Friends and Family: Not on file  . Attends Religious Services: More than 4 times per year  . Active Member of Clubs or Organizations: No  . Attends Archivist Meetings: Never  . Marital Status: Married    Allergies:  Allergies  Allergen Reactions  . Erythromycin Nausea Only    Metabolic Disorder Labs: No results found for: HGBA1C, MPG No results found for: PROLACTIN No results found for: CHOL, TRIG, HDL, CHOLHDL, VLDL, LDLCALC No results found for: TSH  Therapeutic Level Labs: No results found for: LITHIUM No results found for: VALPROATE No components found for:  CBMZ  Current Medications: Current Outpatient Medications  Medication Sig Dispense Refill  . amLODipine (NORVASC) 10 MG tablet Take 10 mg by mouth at bedtime.   0  . ARIPiprazole (ABILIFY) 30 MG tablet Take 1 tablet (30 mg total) by mouth at bedtime. 90 tablet 0  . cyclobenzaprine (FLEXERIL) 10 MG tablet TAKE 1 TABLET BY MOUTH AS NEEDED FOR MUSCLE SPASMS    . DULoxetine (CYMBALTA) 30 MG capsule Take 30 mg capsule with 60 mg capsule (90 mg) daily 90 capsule 0  . DULoxetine (CYMBALTA) 60 MG capsule Take 60 mg capsule with 30 mg capsule (90 mg) daily 90 capsule 0  . gabapentin (NEURONTIN) 300 MG capsule Take 300 mg by mouth once.    . lamoTRIgine (LAMICTAL) 100 MG tablet Take 1 tablet (100 mg total) by mouth daily. 90 tablet 0  . Multiple Vitamins-Minerals (MULTIVITAMIN ADULTS PO) Take by mouth.    . pramipexole (MIRAPEX) 0.5 MG tablet Take 0.5 mg by  mouth every evening.   4  . TURMERIC PO Take by mouth.     No current facility-administered medications for this visit.     Musculoskeletal: Strength & Muscle Tone: unable to assess due to telemed visit Gait & Station: unable to assess due to telemed visit Patient leans: unable to assess due to telemed visit  Psychiatric Specialty Exam: Review of Systems   Last menstrual period 12/25/2015.There is no height or weight on file to calculate BMI.  General Appearance: Fairly Groomed  Eye Contact:  Good  Speech:  Clear and Coherent and Normal Rate  Volume:  Normal  Mood:  Euthymic  Affect:  Congruent  Thought Process:  Goal Directed, Linear and Descriptions of Associations: Intact  Orientation:  Full (Time, Place, and Person)  Thought Content: Logical   Suicidal Thoughts:  No  Homicidal Thoughts:  No  Memory:  Recent;   Good Remote;   Good  Judgement:  Good  Insight:  Good  Psychomotor Activity:  Normal  Concentration:  Concentration: Good and Attention Span: Good  Recall:  Good  Fund of Knowledge: Good  Language: Good  Akathisia:  Negative  Handed:  Right  AIMS (if indicated): not done  Assets:  Communication Skills Desire for Improvement Financial Resources/Insurance Housing Social Support  ADL's:  Intact  Cognition: WNL  Sleep:  Poor     Assessment and Plan: Patient reported intermittent days of feeling sad.  She also reported difficulty in falling and staying asleep.  She was offered doxepin to target insomnia.  She was advised to monitor her mood and to see if there are any patterns.  Patient was informed that we can adjust her antidepressant as needed depending on her feedback at the time of her next visit.  1. MDD (major depressive disorder), recurrent, in full remission ([place])  - ARIPiprazole (ABILIFY) 30 MG tablet; Take 1 tablet (30 mg total) by mouth at bedtime.  Dispense: 90 tablet; Refill: 0 - DULoxetine (CYMBALTA) 60 MG capsule; Take 60 mg capsule with 30  mg capsule (90 mg) daily  Dispense: 90 capsule; Refill: 0 - DULoxetine (CYMBALTA) 30 MG capsule; Take 30 mg capsule with 60 mg capsule (90 mg) daily  Dispense: 90 capsule; Refill: 0 - lamoTRIgine (LAMICTAL) 100 MG tablet; Take 1 tablet (100 mg total) by mouth daily.  Dispense: 90 tablet; Refill: 0 -Start doxepin ([name]) 10 MG capsule; Take one to two capsules at night as needed for sleep  Dispense: 60 capsule; Refill: 1   Follow-up in 2 months.  [place] Crane, MD 02/23/2020, 8:32 AM

## 2020-04-10 ENCOUNTER — Other Ambulatory Visit (HOSPITAL_COMMUNITY): Payer: Self-pay | Admitting: Psychiatry

## 2020-04-10 DIAGNOSIS — F3342 Major depressive disorder, recurrent, in full remission: Secondary | ICD-10-CM

## 2020-04-21 ENCOUNTER — Ambulatory Visit (HOSPITAL_COMMUNITY): Payer: BC Managed Care – PPO | Admitting: Psychiatry

## 2020-04-26 ENCOUNTER — Telehealth (HOSPITAL_COMMUNITY): Payer: BC Managed Care – PPO | Admitting: Psychiatry

## 2020-04-28 ENCOUNTER — Other Ambulatory Visit: Payer: Self-pay | Admitting: Family Medicine

## 2020-04-28 DIAGNOSIS — Z1231 Encounter for screening mammogram for malignant neoplasm of breast: Secondary | ICD-10-CM

## 2020-05-01 ENCOUNTER — Telehealth (INDEPENDENT_AMBULATORY_CARE_PROVIDER_SITE_OTHER): Payer: BC Managed Care – PPO | Admitting: Psychiatry

## 2020-05-01 ENCOUNTER — Encounter (HOSPITAL_COMMUNITY): Payer: Self-pay | Admitting: Psychiatry

## 2020-05-01 ENCOUNTER — Other Ambulatory Visit: Payer: Self-pay

## 2020-05-01 DIAGNOSIS — F3342 Major depressive disorder, recurrent, in full remission: Secondary | ICD-10-CM | POA: Diagnosis not present

## 2020-05-01 MED ORDER — DULOXETINE HCL 60 MG PO CPEP: ORAL_CAPSULE | ORAL | 0 refills | Status: DC

## 2020-05-01 MED ORDER — DULOXETINE HCL 30 MG PO CPEP: ORAL_CAPSULE | ORAL | 0 refills | Status: DC

## 2020-05-01 MED ORDER — ARIPIPRAZOLE 30 MG PO TABS: 30.0000 mg | ORAL_TABLET | Freq: Every day | ORAL | 0 refills | Status: DC

## 2020-05-01 MED ORDER — LAMOTRIGINE 100 MG PO TABS: 100.0000 mg | ORAL_TABLET | Freq: Every day | ORAL | 0 refills | Status: DC

## 2020-05-01 MED ORDER — DOXEPIN HCL 10 MG PO CAPS: 10.0000 mg | ORAL_CAPSULE | Freq: Every day | ORAL | 0 refills | Status: DC

## 2020-05-01 NOTE — Progress Notes (Signed)
[place] MD OP Progress Note  Virtual Visit via Video Note  I connected with Tamara Thomas on 05/01/20 at 11:40 AM EDT by a video enabled telemedicine application and verified that I am speaking with the correct person using two identifiers.  Location: Patient: Home Provider: Clinic   I discussed the limitations of evaluation and management by telemedicine and the availability of in person appointments. The patient expressed understanding and agreed to proceed.  I provided 14 minutes of non-face-to-face time during this encounter.      05/01/2020 11:46 AM Tamara Thomas  MRN:  [id number]  Chief Complaint:  " I am doing much better."  HPI: Patient reported that she is doing much better now.  She found doxepin 10 mg capsule to be very helpful.  She only has 1 capsule at night and that helps her with falling asleep and staying asleep.  She reported that if she misses a dose by mistake she can tell a difference the next day.  She informed that she is still working from home and she enjoys working from home compared to going back to office.  She reported that everything is going well and she would like to keep things the way they are.  Visit Diagnosis:    ICD-10-CM   1. MDD (major depressive disorder), recurrent, in full remission ([place])  F33.42     Past Psychiatric History: Depression  Past Medical History:  Past Medical History:  Diagnosis Date  . Abdominal pain   . Anxiety   . Bipolar disorder ([place])   . Depression   . GERD (gastroesophageal reflux disease)   . Hypertension   . Restless leg syndrome     Past Surgical History:  Procedure Laterality Date  . BARIATRIC SURGERY    . CHOLECYSTECTOMY    . GALLBLADDER SURGERY    . LAPAROSCOPY N/A 07/10/2018   Procedure: LAPAROSCOPY DIAGNOSTIC;  Surgeon: Benjaman Kindler, MD;  Location: [facility] ORS;  Service: Gynecology;  Laterality: N/A;  . PELVIC LYMPH NODE DISSECTION N/A 07/10/2018   Procedure: PELVIC WASHING AND  PERITONEAL BIOPSIES;  Surgeon: Benjaman Kindler, MD;  Location: [facility] ORS;  Service: Gynecology;  Laterality: N/A;  . TONSILLECTOMY      Family Psychiatric History: see below  Family History:  Family History  Problem Relation Age of Onset  . Dementia Mother   . Hyperlipidemia Mother   . Depression Mother   . Hypertension Father   . Diabetes Father   . Hyperlipidemia Father   . Breast cancer Neg Hx     Social History:  Social History   Socioeconomic History  . Marital status: Married    Spouse name: JR  . Number of children: 0  . Years of education: Not on file  . Highest education level: Some college, no degree  Occupational History  . Not on file  Tobacco Use  . Smoking status: Former Smoker    Packs/day: 0.50    Years: 30.00    Pack years: 15.00    Types: Cigarettes    Start date: 03/23/1987    Quit date: 11/14/2018    Years since quitting: 1.4  . Smokeless tobacco: Never Used  Vaping Use  . Vaping Use: Never used  Substance and Sexual Activity  . Alcohol use: Not Currently    Alcohol/week: 0.0 standard drinks  . Drug use: No  . Sexual activity: Yes  Other Topics Concern  . Not on file  Social History Narrative  . Not on file   Social Determinants  of Health   Financial Resource Strain: Low Risk   . Difficulty of Paying Living Expenses: Not hard at all  Food Insecurity: No Food Insecurity  . Worried About Charity fundraiser in the Last Year: Never true  . Ran Out of Food in the Last Year: Never true  Transportation Needs: No Transportation Needs  . Lack of Transportation (Medical): No  . Lack of Transportation (Non-Medical): No  Physical Activity: Inactive  . Days of Exercise per Week: 0 days  . Minutes of Exercise per Session: 0 min  Stress: No Stress Concern Present  . Feeling of Stress : Not at all  Social Connections: Unknown  . Frequency of Communication with Friends and Family: Not on file  . Frequency of Social Gatherings with Friends and  Family: Not on file  . Attends Religious Services: More than 4 times per year  . Active Member of Clubs or Organizations: No  . Attends Archivist Meetings: Never  . Marital Status: Married    Allergies:  Allergies  Allergen Reactions  . Erythromycin Nausea Only    Metabolic Disorder Labs: No results found for: HGBA1C, MPG No results found for: PROLACTIN No results found for: CHOL, TRIG, HDL, CHOLHDL, VLDL, LDLCALC No results found for: TSH  Therapeutic Level Labs: No results found for: LITHIUM No results found for: VALPROATE No components found for:  CBMZ  Current Medications: Current Outpatient Medications  Medication Sig Dispense Refill  . amLODipine (NORVASC) 10 MG tablet Take 10 mg by mouth at bedtime.   0  . ARIPiprazole (ABILIFY) 30 MG tablet Take 1 tablet (30 mg total) by mouth at bedtime. 90 tablet 0  . cyclobenzaprine (FLEXERIL) 10 MG tablet TAKE 1 TABLET BY MOUTH AS NEEDED FOR MUSCLE SPASMS    . doxepin ([name]) 10 MG capsule Take one to two capsules at night as needed for sleep 60 capsule 1  . DULoxetine (CYMBALTA) 30 MG capsule Take 30 mg capsule with 60 mg capsule (90 mg) daily 90 capsule 0  . DULoxetine (CYMBALTA) 60 MG capsule Take 60 mg capsule with 30 mg capsule (90 mg) daily 90 capsule 0  . gabapentin (NEURONTIN) 300 MG capsule Take 300 mg by mouth once.    . lamoTRIgine (LAMICTAL) 100 MG tablet TAKE 1 TABLET(100 MG) BY MOUTH DAILY 90 tablet 0  . Multiple Vitamins-Minerals (MULTIVITAMIN ADULTS PO) Take by mouth.    . pramipexole (MIRAPEX) 0.5 MG tablet Take 0.5 mg by mouth every evening.   4  . TURMERIC PO Take by mouth.     No current facility-administered medications for this visit.     Musculoskeletal: Strength & Muscle Tone: unable to assess due to telemed visit Gait & Station: unable to assess due to telemed visit Patient leans: unable to assess due to telemed visit  Psychiatric Specialty Exam: Review of Systems   Last menstrual  period 12/25/2015.There is no height or weight on file to calculate BMI.  General Appearance: Fairly Groomed  Eye Contact:  Good  Speech:  Clear and Coherent and Normal Rate  Volume:  Normal  Mood:  Euthymic  Affect:  Congruent  Thought Process:  Goal Directed, Linear and Descriptions of Associations: Intact  Orientation:  Full (Time, Place, and Person)  Thought Content: Logical   Suicidal Thoughts:  No  Homicidal Thoughts:  No  Memory:  Recent;   Good Remote;   Good  Judgement:  Good  Insight:  Good  Psychomotor Activity:  Normal  Concentration:  Concentration: Good and Attention Span: Good  Recall:  Good  Fund of Knowledge: Good  Language: Good  Akathisia:  Negative  Handed:  Right  AIMS (if indicated): not done  Assets:  Communication Skills Desire for Improvement Financial Resources/Insurance Housing Social Support  ADL's:  Intact  Cognition: WNL  Sleep:  Good     Assessment and Plan: Patient reported that she is feeling much better and would like to continue same regimen.  1. MDD (major depressive disorder), recurrent, in full remission ([place])  - ARIPiprazole (ABILIFY) 30 MG tablet; Take 1 tablet (30 mg total) by mouth at bedtime.  Dispense: 90 tablet; Refill: 0 - DULoxetine (CYMBALTA) 60 MG capsule; Take 60 mg capsule with 30 mg capsule (90 mg) daily  Dispense: 90 capsule; Refill: 0 - DULoxetine (CYMBALTA) 30 MG capsule; Take 30 mg capsule with 60 mg capsule (90 mg) daily  Dispense: 90 capsule; Refill: 0 - lamoTRIgine (LAMICTAL) 100 MG tablet; Take 1 tablet (100 mg total) by mouth daily.  Dispense: 90 tablet; Refill: 0 -Continue doxepin ([name]) 10 MG capsule; Take one capsule at night as needed for sleep  Dispense: 60 capsule; Refill: 1  Continue same regimen. Follow-up in 3 months.  Nevada Crane, MD 05/01/2020, 11:46 AM

## 2020-05-02 ENCOUNTER — Other Ambulatory Visit: Payer: Self-pay

## 2020-05-02 ENCOUNTER — Ambulatory Visit
Admission: RE | Admit: 2020-05-02 | Discharge: 2020-05-02 | Disposition: A | Discharge status: Home / Self Care | Payer: BC Managed Care – PPO | Source: Ambulatory Visit | Attending: Family Medicine | Admitting: Family Medicine

## 2020-05-02 DIAGNOSIS — Z1231 Encounter for screening mammogram for malignant neoplasm of breast: Secondary | ICD-10-CM

## 2020-07-26 ENCOUNTER — Other Ambulatory Visit: Payer: Self-pay

## 2020-07-26 ENCOUNTER — Encounter (HOSPITAL_COMMUNITY): Payer: Self-pay | Admitting: Psychiatry

## 2020-07-26 ENCOUNTER — Telehealth (INDEPENDENT_AMBULATORY_CARE_PROVIDER_SITE_OTHER): Payer: BC Managed Care – PPO | Admitting: Psychiatry

## 2020-07-26 DIAGNOSIS — F3342 Major depressive disorder, recurrent, in full remission: Secondary | ICD-10-CM

## 2020-07-26 MED ORDER — LAMOTRIGINE 100 MG PO TABS: 100.0000 mg | ORAL_TABLET | Freq: Every day | ORAL | 1 refills | Status: DC

## 2020-07-26 MED ORDER — DULOXETINE HCL 30 MG PO CPEP: ORAL_CAPSULE | ORAL | 1 refills | Status: DC

## 2020-07-26 MED ORDER — ARIPIPRAZOLE 30 MG PO TABS: 30.0000 mg | ORAL_TABLET | Freq: Every day | ORAL | 1 refills | Status: DC

## 2020-07-26 MED ORDER — DOXEPIN HCL 10 MG PO CAPS: 10.0000 mg | ORAL_CAPSULE | Freq: Every day | ORAL | 1 refills | Status: DC

## 2020-07-26 MED ORDER — DULOXETINE HCL 60 MG PO CPEP: ORAL_CAPSULE | ORAL | 1 refills | Status: DC

## 2020-07-26 NOTE — Progress Notes (Signed)
[place] MD OP Progress Note  Virtual Visit via Video Note  I connected with Tamara Thomas on 07/26/20 at  9:00 AM EDT by a video enabled telemedicine application and verified that I am speaking with the correct person using two identifiers.  Location: Patient: Home Provider: Clinic   I discussed the limitations of evaluation and management by telemedicine and the availability of in person appointments. The patient expressed understanding and agreed to proceed.  I provided 15 minutes of non-face-to-face time during this encounter.    07/26/2020 8:58 AM Tamara Thomas  MRN:  [id number]  Chief Complaint:  " Everything is going well."  HPI: Patient reported everything is going well for her.  She informed that she has been busy with work however she has not felt overwhelmed or depressed.  She stated that her daughter is about to have their first grandchild around Thanksgiving and then looking forward to spending the Christmas holidays with them.  She informed that everything is going well and she is able to sleep well with the help of the doxepin at night.  She denies any acute issues or concerns at this time.  Visit Diagnosis:    ICD-10-CM   1. MDD (major depressive disorder), recurrent, in full remission ([place])  F33.42     Past Psychiatric History: Depression  Past Medical History:  Past Medical History:  Diagnosis Date  . Abdominal pain   . Anxiety   . Bipolar disorder ([place])   . Depression   . GERD (gastroesophageal reflux disease)   . Hypertension   . Restless leg syndrome     Past Surgical History:  Procedure Laterality Date  . BARIATRIC SURGERY    . CHOLECYSTECTOMY    . GALLBLADDER SURGERY    . LAPAROSCOPY N/A 07/10/2018   Procedure: LAPAROSCOPY DIAGNOSTIC;  Surgeon: Benjaman Kindler, MD;  Location: [facility] ORS;  Service: Gynecology;  Laterality: N/A;  . PELVIC LYMPH NODE DISSECTION N/A 07/10/2018   Procedure: PELVIC WASHING AND PERITONEAL BIOPSIES;   Surgeon: Benjaman Kindler, MD;  Location: [facility] OR[facility];  Service: Gynecology;  Laterality: N/A;  . TONSILLECTOMY      Family Psychiatric History: see below  Family History:  Family History  Problem Relation Age of Onset  . Dementia Mother   . Hyperlipidemia Mother   . Depression Mother   . Hypertension Father   . Diabetes Father   . Hyperlipidemia Father   . Breast cancer Neg Hx     Social History:  Social History   Socioeconomic History  . Marital status: Married    Spouse name: JR  . Number of children: 0  . Years of education: Not on file  . Highest education level: Some college, no degree  Occupational History  . Not on file  Tobacco Use  . Smoking status: Former Smoker    Packs/day: 0.50    Years: 30.00    Pack years: 15.00    Types: Cigarettes    Start date: 03/23/1987    Quit date: 11/14/2018    Years since quitting: 1.6  . Smokeless tobacco: Never Used  Vaping Use  . Vaping Use: Never used  Substance and Sexual Activity  . Alcohol use: Not Currently    Alcohol/week: 0.0 standard drinks  . Drug use: No  . Sexual activity: Yes  Other Topics Concern  . Not on file  Social History Narrative  . Not on file   Social Determinants of Health   Financial Resource Strain:   . Difficulty of Paying Living  Expenses: Not on file  Food Insecurity:   . Worried About Charity fundraiser in the Last Year: Not on file  . Ran Out of Food in the Last Year: Not on file  Transportation Needs:   . Lack of Transportation (Medical): Not on file  . Lack of Transportation (Non-Medical): Not on file  Physical Activity:   . Days of Exercise per Week: Not on file  . Minutes of Exercise per Session: Not on file  Stress:   . Feeling of Stress : Not on file  Social Connections:   . Frequency of Communication with Friends and Family: Not on file  . Frequency of Social Gatherings with Friends and Family: Not on file  . Attends Religious Services: Not on file  . Active Member of  Clubs or Organizations: Not on file  . Attends Archivist Meetings: Not on file  . Marital Status: Not on file    Allergies:  Allergies  Allergen Reactions  . Erythromycin Nausea Only    Metabolic Disorder Labs: No results found for: HGBA1C, MPG No results found for: PROLACTIN No results found for: CHOL, TRIG, HDL, CHOLHDL, VLDL, LDLCALC No results found for: TSH  Therapeutic Level Labs: No results found for: LITHIUM No results found for: VALPROATE No components found for:  CBMZ  Current Medications: Current Outpatient Medications  Medication Sig Dispense Refill  . amLODipine (NORVASC) 10 MG tablet Take 10 mg by mouth at bedtime.   0  . ARIPiprazole (ABILIFY) 30 MG tablet Take 1 tablet (30 mg total) by mouth at bedtime. 90 tablet 0  . cyclobenzaprine (FLEXERIL) 10 MG tablet TAKE 1 TABLET BY MOUTH AS NEEDED FOR MUSCLE SPASMS    . doxepin ([name]) 10 MG capsule Take 1 capsule (10 mg total) by mouth at bedtime. 90 capsule 0  . DULoxetine (CYMBALTA) 30 MG capsule Take 30 mg capsule with 60 mg capsule (90 mg) daily 90 capsule 0  . DULoxetine (CYMBALTA) 60 MG capsule Take 60 mg capsule with 30 mg capsule (90 mg) daily 90 capsule 0  . gabapentin (NEURONTIN) 300 MG capsule Take 300 mg by mouth once.    . lamoTRIgine (LAMICTAL) 100 MG tablet Take 1 tablet (100 mg total) by mouth daily. 90 tablet 0  . Multiple Vitamins-Minerals (MULTIVITAMIN ADULTS PO) Take by mouth.    . pramipexole (MIRAPEX) 0.5 MG tablet Take 0.5 mg by mouth every evening.   4  . TURMERIC PO Take by mouth.     No current facility-administered medications for this visit.     Musculoskeletal: Strength & Muscle Tone: unable to assess due to telemed visit Gait & Station: unable to assess due to telemed visit Patient leans: unable to assess due to telemed visit  Psychiatric Specialty Exam: Review of Systems   Last menstrual period 12/25/2015.There is no height or weight on file to calculate BMI.   General Appearance: Fairly Groomed  Eye Contact:  Good  Speech:  Clear and Coherent and Normal Rate  Volume:  Normal  Mood:  Euthymic  Affect:  Congruent  Thought Process:  Goal Directed, Linear and Descriptions of Associations: Intact  Orientation:  Full (Time, Place, and Person)  Thought Content: Logical   Suicidal Thoughts:  No  Homicidal Thoughts:  No  Memory:  Recent;   Good Remote;   Good  Judgement:  Good  Insight:  Good  Psychomotor Activity:  Normal  Concentration:  Concentration: Good and Attention Span: Good  Recall:  Good  Fund  of Knowledge: Good  Language: Good  Akathisia:  Negative  Handed:  Right  AIMS (if indicated): not done  Assets:  Communication Skills Desire for Improvement Financial Resources/Insurance Housing Social Support  ADL's:  Intact  Cognition: WNL  Sleep:  Good     Assessment and Plan: Patient reported that she is feeling much better and would like to continue same regimen.  1. MDD (major depressive disorder), recurrent, in full remission ([place])  - ARIPiprazole (ABILIFY) 30 MG tablet; Take 1 tablet (30 mg total) by mouth at bedtime.  Dispense: 90 tablet; Refill: 0 - DULoxetine (CYMBALTA) 60 MG capsule; Take 60 mg capsule with 30 mg capsule (90 mg) daily  Dispense: 90 capsule; Refill: 0 - DULoxetine (CYMBALTA) 30 MG capsule; Take 30 mg capsule with 60 mg capsule (90 mg) daily  Dispense: 90 capsule; Refill: 0 - lamoTRIgine (LAMICTAL) 100 MG tablet; Take 1 tablet (100 mg total) by mouth daily.  Dispense: 90 tablet; Refill: 0 -Continue doxepin ([name]) 10 MG capsule; Take one capsule at night as needed for sleep  Dispense: 60 capsule; Refill: 1  Continue same regimen. Follow-up in 4 months.  Nev[place] Crane, MD 07/26/2020, 8:58 AM

## 2020-11-17 ENCOUNTER — Encounter (HOSPITAL_COMMUNITY): Payer: Self-pay | Admitting: Psychiatry

## 2020-11-17 ENCOUNTER — Telehealth (INDEPENDENT_AMBULATORY_CARE_PROVIDER_SITE_OTHER): Payer: BC Managed Care – PPO | Admitting: Psychiatry

## 2020-11-17 ENCOUNTER — Other Ambulatory Visit: Payer: Self-pay

## 2020-11-17 DIAGNOSIS — F3342 Major depressive disorder, recurrent, in full remission: Secondary | ICD-10-CM

## 2020-11-17 MED ORDER — LAMOTRIGINE 100 MG PO TABS: 100.0000 mg | ORAL_TABLET | Freq: Every day | ORAL | 1 refills | Status: DC

## 2020-11-17 MED ORDER — ARIPIPRAZOLE 30 MG PO TABS: 30.0000 mg | ORAL_TABLET | Freq: Every day | ORAL | 1 refills | Status: DC

## 2020-11-17 MED ORDER — DULOXETINE HCL 60 MG PO CPEP: ORAL_CAPSULE | ORAL | 1 refills | Status: DC

## 2020-11-17 MED ORDER — DOXEPIN HCL 25 MG PO CAPS: ORAL_CAPSULE | ORAL | 1 refills | Status: DC

## 2020-11-17 MED ORDER — DULOXETINE HCL 30 MG PO CPEP: ORAL_CAPSULE | ORAL | 1 refills | Status: DC

## 2020-11-17 NOTE — Progress Notes (Signed)
Tamara Thomas Progress Note  Virtual Visit via Video Note  I connected with Tamara Thomas on 11/17/20 at  8:20 AM EST by a video enabled telemedicine application and verified that I am speaking with the correct person using two identifiers.  Location: Patient: Home Provider: Clinic   I discussed the limitations of evaluation and management by telemedicine and the availability of in person appointments. The patient expressed understanding and agreed to proceed.  I provided 15 minutes of non-face-to-face time during this encounter.    11/17/2020 8:57 AM Tamara Thomas  MRN:  [id number]  Chief Complaint:  " Things are going well, only thing is I have not been sleeping well lately."  HPI: Pt reported that her mood has been stable. She has been doing well personally and professionally. However, for the past couple of months she has been having a hard time with sleep.  She stated that she tried taking 2 capsules of doxepin 10 mg strength but they did not work.  She stated that she had old prescription of trazodone and she restarted taking that along with the doxepin.  However when she takes the trazodone she has lot of drowsiness the next morning.  She has been trying to take trazodone earlier in the evening however she has not been able to figure out the best time to take it to prevent next daytime's drowsiness. She stated that the family is contemplating on moving to [place] to be near her children.  She is not sure when they will now finalize the decision and when they will exactly move.  She is excited about this.  She recently visited [facility] to see her new grandbaby. Writer recommended trial of higher dose of doxepin as trazodone causes her to have next day grogginess.  Patient was agreeable to try higher dose of doxepin. Writer advised her to touch base with the Probation officer in June and if they finalize a decision to move to [place] then Probation officer will be able to provide him  a 40-month prescription so that she can have plenty of time to find a new provider there.   Visit Diagnosis:    ICD-10-CM   1. MDD (major depressive disorder), recurrent, in full remission ([place])  F33.42 DULoxetine (CYMBALTA) 30 MG capsule    DULoxetine (CYMBALTA) 60 MG capsule    ARIPiprazole (ABILIFY) 30 MG tablet    lamoTRIgine (LAMICTAL) 100 MG tablet    doxepin ([name]) 25 MG capsule    Past Psychiatric History: Depression  Past Medical History:  Past Medical History:  Diagnosis Date  . Abdominal pain   . Anxiety   . Bipolar disorder ([place])   . Depression   . GERD (gastroesophageal reflux disease)   . Hypertension   . Restless leg syndrome     Past Surgical History:  Procedure Laterality Date  . BARIATRIC SURGERY    . CHOLECYSTECTOMY    . GALLBLADDER SURGERY    . LAPAROSCOPY N/A 07/10/2018   Procedure: LAPAROSCOPY DIAGNOSTIC;  Surgeon: Tamara Kindler, MD;  Location: [facility] ORS;  Service: Gynecology;  Laterality: N/A;  . PELVIC LYMPH NODE DISSECTION N/A 07/10/2018   Procedure: PELVIC WASHING AND PERITONEAL BIOPSIES;  Surgeon: Tamara Kindler, MD;  Location: [facility] ORS;  Service: Gynecology;  Laterality: N/A;  . TONSILLECTOMY      Family Psychiatric History: see below  Family History:  Family History  Problem Relation Age of Onset  . Dementia Mother   . Hyperlipidemia Mother   . Depression Mother   .  Hypertension Father   . Diabetes Father   . Hyperlipidemia Father   . Breast cancer Neg Hx     Social History:  Social History   Socioeconomic History  . Marital status: Married    Spouse name: JR  . Number of children: 0  . Years of education: Not on file  . Highest education level: Some college, no degree  Occupational History  . Not on file  Tobacco Use  . Smoking status: Former Smoker    Packs/day: 0.50    Years: 30.00    Pack years: 15.00    Types: Cigarettes    Start date: 03/23/1987    Quit date: 11/14/2018    Years since quitting: 2.0  .  Smokeless tobacco: Never Used  Vaping Use  . Vaping Use: Never used  Substance and Sexual Activity  . Alcohol use: Not Currently    Alcohol/week: 0.0 standard drinks  . Drug use: No  . Sexual activity: Yes  Other Topics Concern  . Not on file  Social History Narrative  . Not on file   Social Determinants of Health   Financial Resource Strain: Not on file  Food Insecurity: Not on file  Transportation Needs: Not on file  Physical Activity: Not on file  Stress: Not on file  Social Connections: Not on file    Allergies:  Allergies  Allergen Reactions  . Erythromycin Nausea Only    Metabolic Disorder Labs: No results found for: HGBA1C, MPG No results found for: PROLACTIN No results found for: CHOL, TRIG, HDL, CHOLHDL, VLDL, LDLCALC No results found for: TSH  Therapeutic Level Labs: No results found for: LITHIUM No results found for: VALPROATE No components found for:  CBMZ  Current Medications: Current Outpatient Medications  Medication Sig Dispense Refill  . doxepin ([name]) 25 MG capsule Take one to two capsules at bedtime as needed for sleep 180 capsule 1  . amLODipine (NORVASC) 10 MG tablet Take 10 mg by mouth at bedtime.   0  . ARIPiprazole (ABILIFY) 30 MG tablet Take 1 tablet (30 mg total) by mouth at bedtime. 90 tablet 1  . cyclobenzaprine (FLEXERIL) 10 MG tablet TAKE 1 TABLET BY MOUTH AS NEEDED FOR MUSCLE SPASMS    . DULoxetine (CYMBALTA) 30 MG capsule Take 30 mg capsule with 60 mg capsule (90 mg) daily 90 capsule 1  . DULoxetine (CYMBALTA) 60 MG capsule Take 60 mg capsule with 30 mg capsule (90 mg) daily 90 capsule 1  . gabapentin (NEURONTIN) 300 MG capsule Take 300 mg by mouth once.    . lamoTRIgine (LAMICTAL) 100 MG tablet Take 1 tablet (100 mg total) by mouth daily. 90 tablet 1  . Multiple Vitamins-Minerals (MULTIVITAMIN ADULTS PO) Take by mouth.    . pramipexole (MIRAPEX) 0.5 MG tablet Take 0.5 mg by mouth every evening.   4  . TURMERIC PO Take by mouth.      No current facility-administered medications for this visit.      Psychiatric Specialty Exam: Review of Systems   Last menstrual period 12/25/2015.There is no height or weight on file to calculate BMI.  General Appearance: Fairly Groomed  Eye Contact:  Good  Speech:  Clear and Coherent and Normal Rate  Volume:  Normal  Mood:  Euthymic  Affect:  Congruent  Thought Process:  Goal Directed, Linear and Descriptions of Associations: Intact  Orientation:  Full (Time, Place, and Person)  Thought Content: Logical   Suicidal Thoughts:  No  Homicidal Thoughts:  No  Memory:  Recent;   Good Remote;   Good  Judgement:  Good  Insight:  Good  Psychomotor Activity:  Normal  Concentration:  Concentration: Good and Attention Span: Good  Recall:  Good  Fund of Knowledge: Good  Language: Good  Akathisia:  Negative  Handed:  Right  AIMS (if indicated): not done  Assets:  Communication Skills Desire for Improvement Financial Resources/Insurance Housing Social Support  ADL's:  Intact  Cognition: WNL  Sleep:  Good     Assessment and Plan: Patient reported that her mood has been stable however has been having difficulty with sleep.  Will adjust the dose of doxepin for optimal effect.  1. MDD (major depressive disorder), recurrent, in full remission ([place])  - DULoxetine (CYMBALTA) 30 MG capsule; Take 30 mg capsule with 60 mg capsule (90 mg) daily  Dispense: 90 capsule; Refill: 1 - DULoxetine (CYMBALTA) 60 MG capsule; Take 60 mg capsule with 30 mg capsule (90 mg) daily  Dispense: 90 capsule; Refill: 1 - ARIPiprazole (ABILIFY) 30 MG tablet; Take 1 tablet (30 mg total) by mouth at bedtime.  Dispense: 90 tablet; Refill: 1 - lamoTRIgine (LAMICTAL) 100 MG tablet; Take 1 tablet (100 mg total) by mouth daily.  Dispense: 90 tablet; Refill: 1 -Increase doxepin ([name]) 25 MG capsule; Take one to two capsules at bedtime as needed for sleep  Dispense: 180 capsule; Refill: 1  Follow-up in 3.5  months. (Patient and family are contemplating about moving to [place] but have not decided yet.)   [place] Crane, MD 11/17/2020, 8:57 AM

## 2021-01-26 IMAGING — MG DIGITAL SCREENING BILATERAL MAMMOGRAM WITH TOMO AND CAD
8 series · 8 of 24 positions shown · non-contrast
Comparison: Previous exam(s).

CLINICAL DATA: Screening.

EXAM:
DIGITAL SCREENING BILATERAL MAMMOGRAM WITH TOMO AND CAD

[L CC synth-2D]
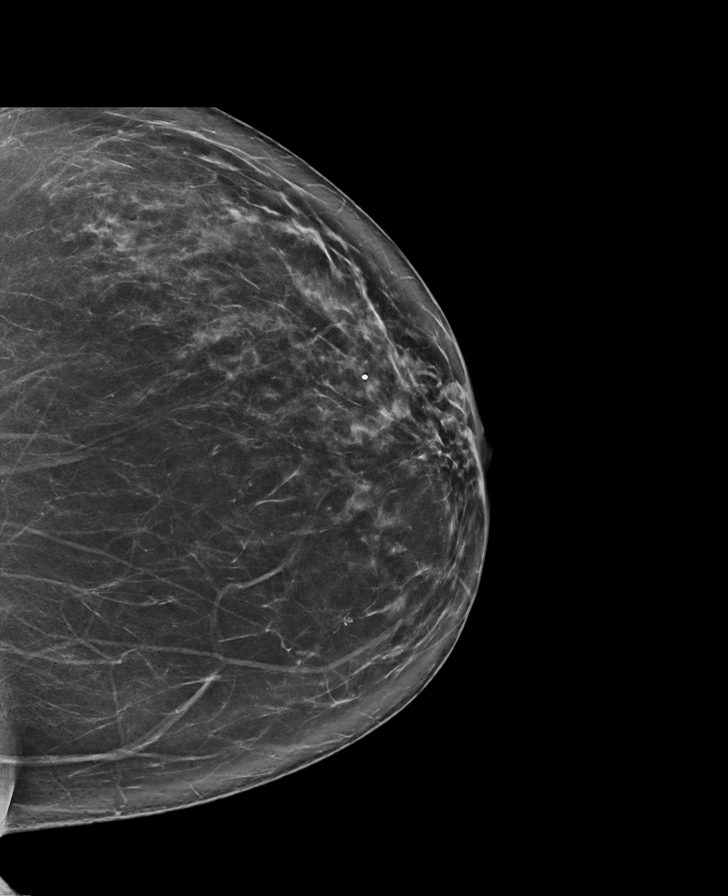

[L MLO synth-2D]
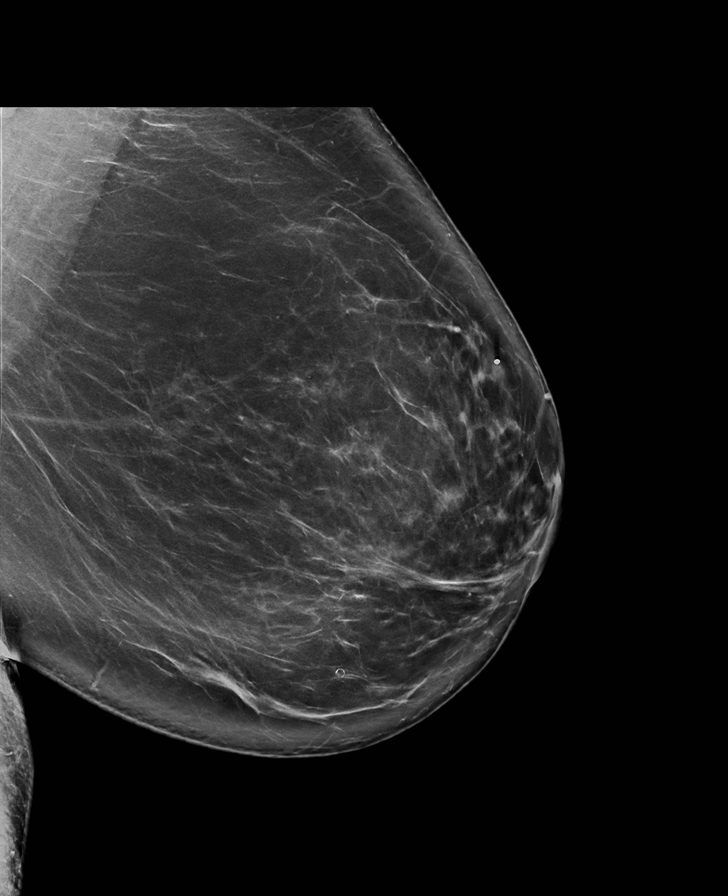

[R MLO synth-2D]
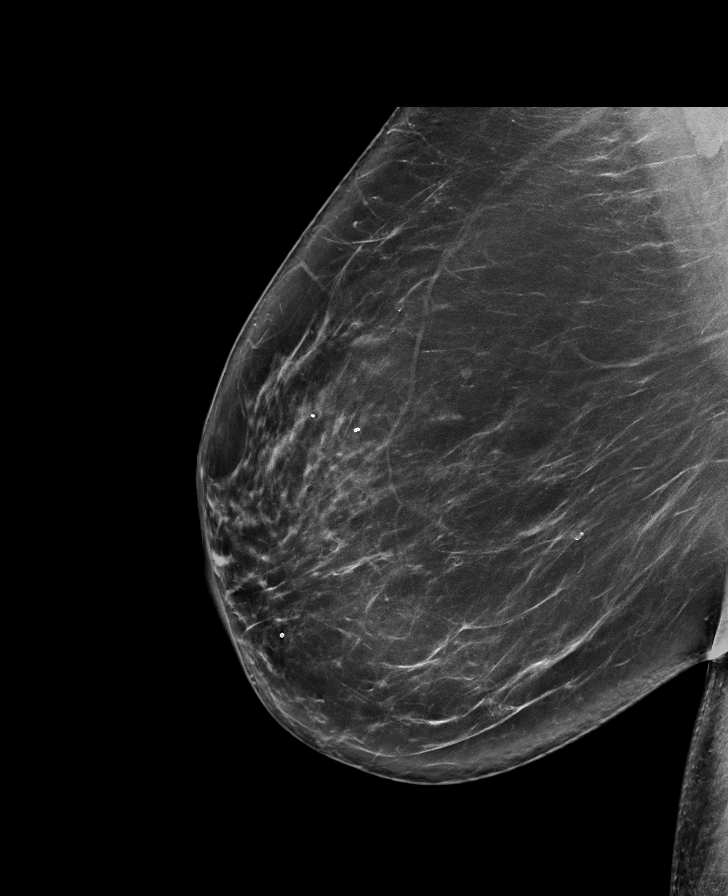

[R CC synth-2D]
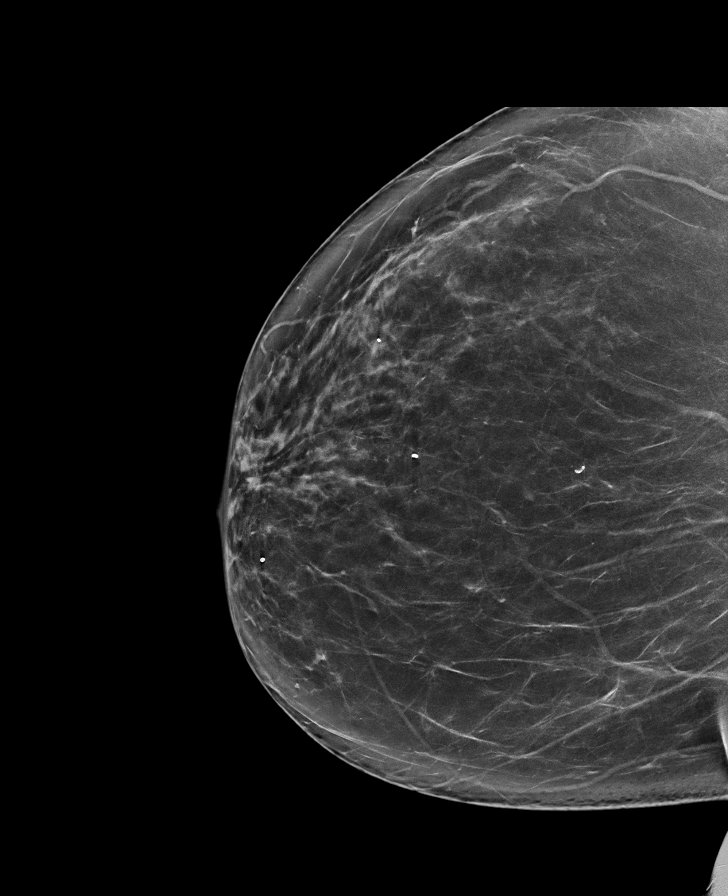

[L MLO tomo · tomo slice 51/100.0]
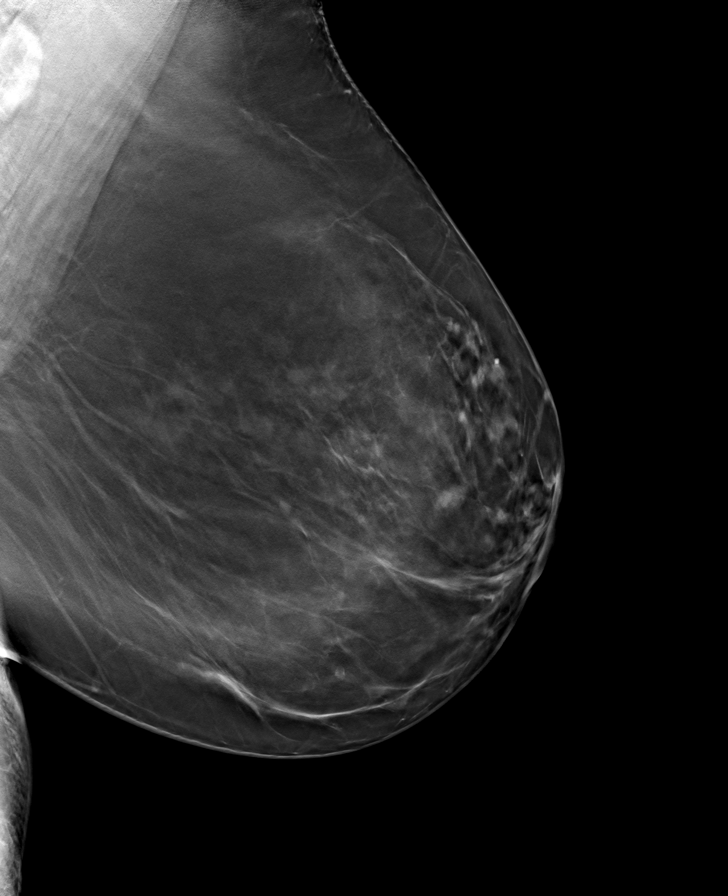

[R CC tomo · tomo slice 45/89.0]
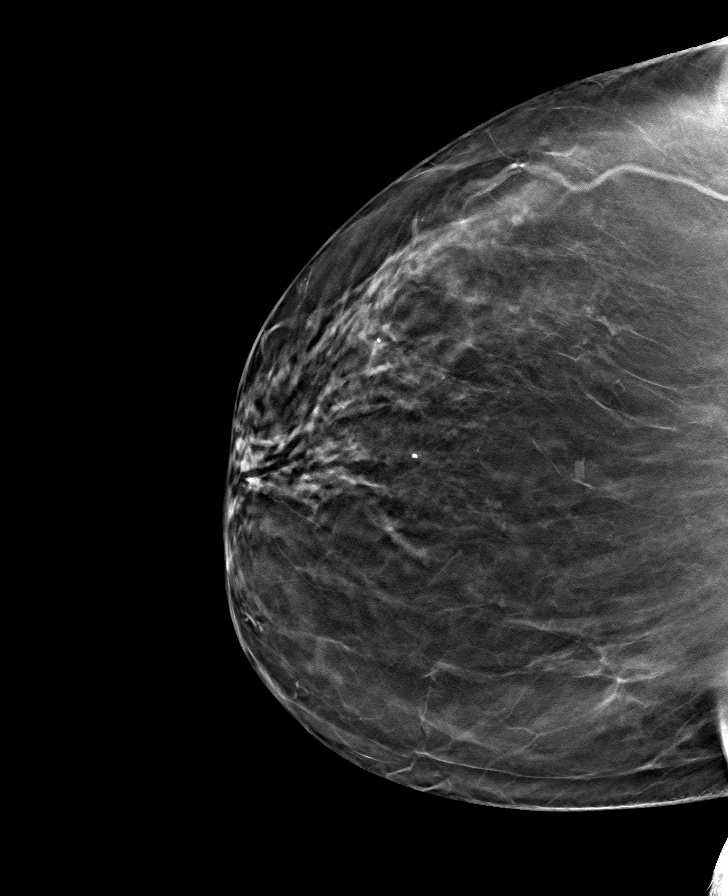

[L CC tomo · tomo slice 45/88.0]
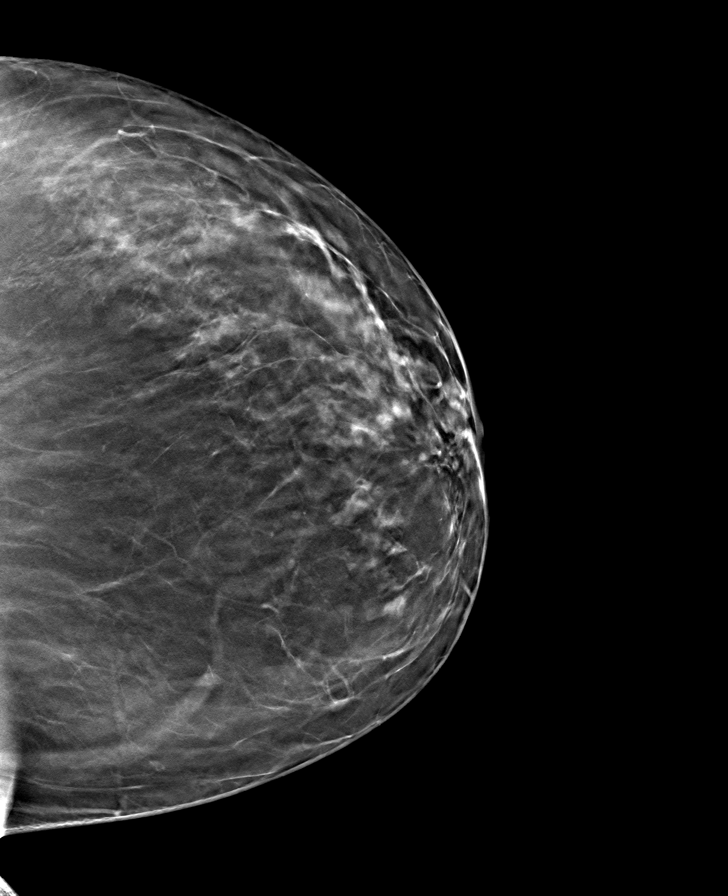

[R MLO tomo · tomo slice 49/97.0]
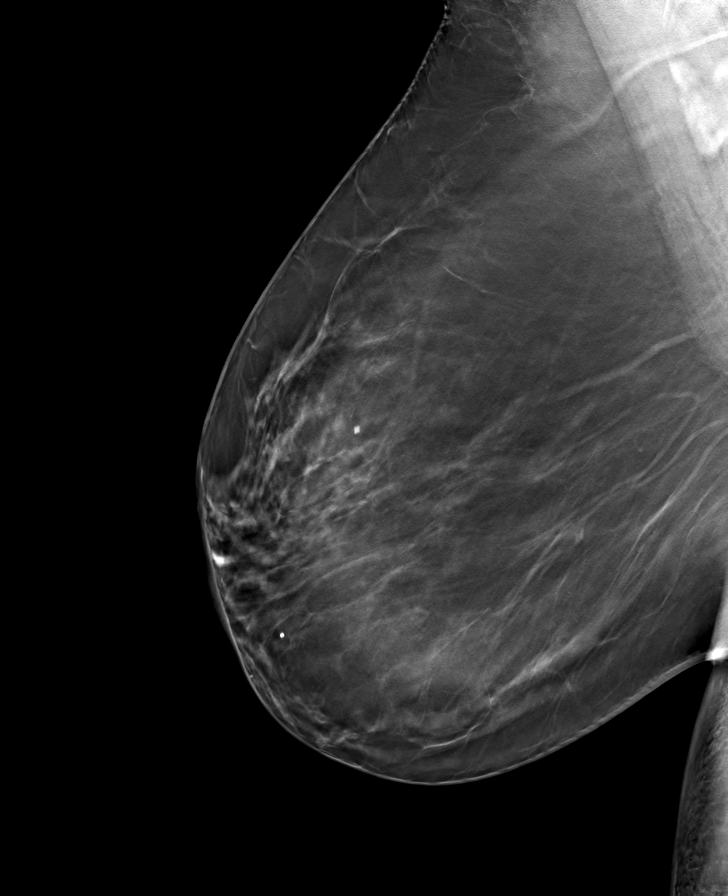

[8 of 24 positions shown; findings below may reference images not displayed]

ACR Breast Density Category b: There are scattered areas of
fibroglandular density.
FINDINGS: There are no findings suspicious for malignancy. Images were
processed with CAD.
IMPRESSION: No mammographic evidence of malignancy. A result letter of this
screening mammogram will be mailed directly to the patient.

RECOMMENDATION:
Screening mammogram in one year. (Code:[id number])

BI-RADS CATEGORY  1: Negative.

## 2021-02-11 ENCOUNTER — Other Ambulatory Visit (HOSPITAL_COMMUNITY): Payer: Self-pay | Admitting: Psychiatry

## 2021-02-11 DIAGNOSIS — F3342 Major depressive disorder, recurrent, in full remission: Secondary | ICD-10-CM

## 2021-03-02 ENCOUNTER — Other Ambulatory Visit: Payer: Self-pay

## 2021-03-02 ENCOUNTER — Telehealth (INDEPENDENT_AMBULATORY_CARE_PROVIDER_SITE_OTHER): Payer: BC Managed Care – PPO | Admitting: Psychiatry

## 2021-03-02 ENCOUNTER — Encounter (HOSPITAL_COMMUNITY): Payer: Self-pay | Admitting: Psychiatry

## 2021-03-02 DIAGNOSIS — F3342 Major depressive disorder, recurrent, in full remission: Secondary | ICD-10-CM | POA: Diagnosis not present

## 2021-03-02 MED ORDER — DOXEPIN HCL 25 MG PO CAPS: ORAL_CAPSULE | ORAL | 1 refills | Status: AC

## 2021-03-02 MED ORDER — LAMOTRIGINE 100 MG PO TABS: 100.0000 mg | ORAL_TABLET | Freq: Every day | ORAL | 0 refills | Status: AC

## 2021-03-02 MED ORDER — DULOXETINE HCL 30 MG PO CPEP: ORAL_CAPSULE | ORAL | 0 refills | Status: AC

## 2021-03-02 MED ORDER — ARIPIPRAZOLE 30 MG PO TABS: 30.0000 mg | ORAL_TABLET | Freq: Every day | ORAL | 0 refills | Status: AC

## 2021-03-02 NOTE — Progress Notes (Signed)
[facility] MD OP Progress Note  Virtual Visit via Video Note  I connected with Tamara Thomas on 03/02/21 at  8:30 AM EDT by a video enabled telemedicine application and verified that I am speaking with the correct person using two identifiers.  Location: Patient: Home Provider: Clinic   I discussed the limitations of evaluation and management by telemedicine and the availability of in person appointments. The patient expressed understanding and agreed to proceed.  I provided 14 minutes of non-face-to-face time during this encounter.     03/02/2021 8:39 AM Tamara Thomas  MRN:  [id number]  Chief Complaint:  " I am doing pretty well."  HPI: Pt informed that she is doing well. She and her family recently moved to [place] last week. She is happy with this decision. She is now closer to her daughter and her family. She informed that her sleep has improved, she takes one capsule of Doxepin 25 mg at night. Her mood has been stable. She denied any side effects to her regimen.  She is planning to start looking for a psychiatrist locally soon.  Visit Diagnosis:    ICD-10-CM   1. MDD (major depressive disorder), recurrent, in full remission ([place])  F33.42 lamoTRIgine (LAMICTAL) 100 MG tablet    doxepin ([name]) 25 MG capsule    ARIPiprazole (ABILIFY) 30 MG tablet    DULoxetine (CYMBALTA) 30 MG capsule      Past Psychiatric History: Depression  Past Medical History:  Past Medical History:  Diagnosis Date   Abdominal pain    Anxiety    Bipolar disorder ([place])    Depression    GERD (gastroesophageal reflux disease)    Hypertension    Restless leg syndrome     Past Surgical History:  Procedure Laterality Date   BARIATRIC SURGERY     CHOLECYSTECTOMY     GALLBLADDER SURGERY     LAPAROSCOPY N/A 07/10/2018   Procedure: LAPAROSCOPY DIAGNOSTIC;  Surgeon: Benjaman Kindler, MD;  Location: [facility];  Service: Gynecology;  Laterality: N/A;   PELVIC LYMPH NODE DISSECTION  N/A 07/10/2018   Procedure: PELVIC WASHING AND PERITONEAL BIOPSIES;  Surgeon: Benjaman Kindler, MD;  Location: [facility];  Service: Gynecology;  Laterality: N/A;   TONSILLECTOMY      Family Psychiatric History: see below  Family History:  Family History  Problem Relation Age of Onset   Dementia Mother    Hyperlipidemia Mother    Depression Mother    Hypertension Father    Diabetes Father    Hyperlipidemia Father    Breast cancer Neg Hx     Social History:  Social History   Socioeconomic History   Marital status: Married    Spouse name: JR   Number of children: 0   Years of education: Not on file   Highest education level: Some college, no degree  Occupational History   Not on file  Tobacco Use   Smoking status: Former    Packs/day: 0.50    Years: 30.00    Pack years: 15.00    Types: Cigarettes    Start date: 03/23/1987    Quit date: 11/14/2018    Years since quitting: 2.2   Smokeless tobacco: Never  Vaping Use   Vaping Use: Never used  Substance and Sexual Activity   Alcohol use: Not Currently    Alcohol/week: 0.0 standard drinks   Drug use: No   Sexual activity: Yes  Other Topics Concern   Not on file  Social History Narrative   Not on  file   Social Determinants of Health   Financial Resource Strain: Not on file  Food Insecurity: Not on file  Transportation Needs: Not on file  Physical Activity: Not on file  Stress: Not on file  Social Connections: Not on file    Allergies:  Allergies  Allergen Reactions   Erythromycin Nausea Only    Metabolic Disorder Labs: No results found for: HGBA1C, MPG No results found for: PROLACTIN No results found for: CHOL, TRIG, HDL, CHOLHDL, VLDL, LDLCALC No results found for: TSH  Therapeutic Level Labs: No results found for: LITHIUM No results found for: VALPROATE No components found for:  CBMZ  Current Medications: Current Outpatient Medications  Medication Sig Dispense Refill   amLODipine (NORVASC) 10 MG  tablet Take 10 mg by mouth at bedtime.   0   ARIPiprazole (ABILIFY) 30 MG tablet Take 1 tablet (30 mg total) by mouth at bedtime. 90 tablet 0   cyclobenzaprine (FLEXERIL) 10 MG tablet TAKE 1 TABLET BY MOUTH AS NEEDED FOR MUSCLE SPASMS     doxepin ([name]) 25 MG capsule Take one to two capsules at bedtime as needed for sleep 180 capsule 1   DULoxetine (CYMBALTA) 30 MG capsule Take 3 capsules daily (90 mg) 270 capsule 0   gabapentin (NEURONTIN) 300 MG capsule Take 300 mg by mouth once.     lamoTRIgine (LAMICTAL) 100 MG tablet Take 1 tablet (100 mg total) by mouth daily. 90 tablet 0   Multiple Vitamins-Minerals (MULTIVITAMIN ADULTS PO) Take by mouth.     pramipexole (MIRAPEX) 0.5 MG tablet Take 0.5 mg by mouth every evening.   4   TURMERIC PO Take by mouth.     No current facility-administered medications for this visit.      Psychiatric Specialty Exam: Review of Systems   Last menstrual period 12/25/2015.There is no height or weight on file to calculate BMI.  General Appearance: Fairly Groomed  Eye Contact:  Good  Speech:  Clear and Coherent and Normal Rate  Volume:  Normal  Mood:  Euthymic  Affect:  Congruent  Thought Process:  Goal Directed, Linear, and Descriptions of Associations: Intact  Orientation:  Full (Time, Place, and Person)  Thought Content: Logical   Suicidal Thoughts:  No  Homicidal Thoughts:  No  Memory:  Recent;   Good Remote;   Good  Judgement:  Good  Insight:  Good  Psychomotor Activity:  Normal  Concentration:  Concentration: Good and Attention Span: Good  Recall:  Good  Fund of Knowledge: Good  Language: Good  Akathisia:  Negative  Handed:  Right  AIMS (if indicated): not done  Assets:  Communication Skills Desire for Improvement Financial Resources/Insurance Housing Social Support  ADL's:  Intact  Cognition: WNL  Sleep:  Good     Assessment and Plan: Pt is stable on her regimen. She and her family relocated to [place] last week. She was given  last prescription for 3 months, she asked it to be sent to her local pharmacy in [place], [place].   1. MDD (major depressive disorder), recurrent, in full remission ([place])  - lamoTRIgine (LAMICTAL) 100 MG tablet; Take 1 tablet (100 mg total) by mouth daily.  Dispense: 90 tablet; Refill: 0 - doxepin ([name]) 25 MG capsule; Take one to two capsules at bedtime as needed for sleep  Dispense: 180 capsule; Refill: 1 - ARIPiprazole (ABILIFY) 30 MG tablet; Take 1 tablet (30 mg total) by mouth at bedtime.  Dispense: 90 tablet; Refill: 0 - DULoxetine (CYMBALTA) 30 MG  capsule; Take 3 capsules daily (90 mg)  Dispense: 270 capsule; Refill: 0  Continue same regimen. Pt has now relocated to [place] and is going to be looking for a psychiatrist there for continuity of her care. She was also informed that the writer is leaving the office.  [place] Crane, MD 03/02/2021, 8:39 AM

## 2021-05-29 ENCOUNTER — Other Ambulatory Visit (HOSPITAL_COMMUNITY): Payer: Self-pay | Admitting: Psychiatry

## 2021-05-29 DIAGNOSIS — F3342 Major depressive disorder, recurrent, in full remission: Secondary | ICD-10-CM
